# Patient Record
Sex: Male | Born: 1960 | Race: White | Hispanic: No | Smoking: Former smoker
Health system: Southern US, Community
[De-identification: ages and names within clinical notes are randomized; demographics above are authoritative.]

## PROBLEM LIST (undated history)

## (undated) DIAGNOSIS — M545 Low back pain: Secondary | ICD-10-CM

## (undated) DIAGNOSIS — E538 Deficiency of other specified B group vitamins: Secondary | ICD-10-CM

## (undated) DIAGNOSIS — G8929 Other chronic pain: Secondary | ICD-10-CM

## (undated) DIAGNOSIS — M47816 Spondylosis without myelopathy or radiculopathy, lumbar region: Secondary | ICD-10-CM

## (undated) DIAGNOSIS — I10 Essential (primary) hypertension: Secondary | ICD-10-CM

## (undated) DIAGNOSIS — K900 Celiac disease: Secondary | ICD-10-CM

## (undated) DIAGNOSIS — D649 Anemia, unspecified: Secondary | ICD-10-CM

## (undated) DIAGNOSIS — E7800 Pure hypercholesterolemia, unspecified: Secondary | ICD-10-CM

## (undated) DIAGNOSIS — F1021 Alcohol dependence, in remission: Secondary | ICD-10-CM

## (undated) HISTORY — PX: COLONOSCOPY: SHX174

## (undated) HISTORY — DX: Spondylosis without myelopathy or radiculopathy, lumbar region: M47.816

## (undated) HISTORY — PX: GANGLION CYST EXCISION: SHX1691

## (undated) HISTORY — DX: Low back pain: M54.5

## (undated) HISTORY — DX: Pure hypercholesterolemia, unspecified: E78.00

## (undated) HISTORY — DX: Essential (primary) hypertension: I10

## (undated) HISTORY — DX: Alcohol dependence, in remission: F10.21

## (undated) HISTORY — DX: Other chronic pain: G89.29

## (undated) HISTORY — DX: Celiac disease: K90.0

---

## 2007-12-08 ENCOUNTER — Ambulatory Visit: Payer: Self-pay | Admitting: Gastroenterology

## 2013-12-28 DIAGNOSIS — F32A Depression, unspecified: Secondary | ICD-10-CM | POA: Insufficient documentation

## 2013-12-28 DIAGNOSIS — M545 Low back pain, unspecified: Secondary | ICD-10-CM | POA: Insufficient documentation

## 2013-12-28 DIAGNOSIS — F329 Major depressive disorder, single episode, unspecified: Secondary | ICD-10-CM | POA: Insufficient documentation

## 2013-12-28 DIAGNOSIS — I10 Essential (primary) hypertension: Secondary | ICD-10-CM

## 2013-12-28 DIAGNOSIS — G8929 Other chronic pain: Secondary | ICD-10-CM

## 2013-12-28 HISTORY — DX: Other chronic pain: G89.29

## 2013-12-28 HISTORY — DX: Low back pain, unspecified: M54.50

## 2013-12-28 HISTORY — DX: Essential (primary) hypertension: I10

## 2014-02-02 DIAGNOSIS — M47816 Spondylosis without myelopathy or radiculopathy, lumbar region: Secondary | ICD-10-CM

## 2014-02-02 HISTORY — DX: Spondylosis without myelopathy or radiculopathy, lumbar region: M47.816

## 2014-04-08 DIAGNOSIS — E7800 Pure hypercholesterolemia, unspecified: Secondary | ICD-10-CM

## 2014-04-08 HISTORY — DX: Pure hypercholesterolemia, unspecified: E78.00

## 2014-04-15 ENCOUNTER — Ambulatory Visit: Payer: Self-pay | Admitting: Internal Medicine

## 2014-06-27 ENCOUNTER — Ambulatory Visit: Payer: Self-pay | Admitting: Internal Medicine

## 2014-08-21 DIAGNOSIS — K900 Celiac disease: Secondary | ICD-10-CM

## 2014-08-21 HISTORY — DX: Celiac disease: K90.0

## 2014-09-15 ENCOUNTER — Ambulatory Visit
Admit: 2014-09-15 | Disposition: A | Discharge status: Home / Self Care | Payer: Self-pay | Attending: Gastroenterology | Admitting: Gastroenterology

## 2014-09-19 ENCOUNTER — Encounter: Payer: Self-pay | Admitting: Internal Medicine

## 2017-05-29 ENCOUNTER — Encounter: Payer: Self-pay | Admitting: Urology

## 2017-05-29 ENCOUNTER — Ambulatory Visit (INDEPENDENT_AMBULATORY_CARE_PROVIDER_SITE_OTHER): Admitting: Urology

## 2017-05-29 VITALS — BP 148/84 | HR 88 | Ht 66.0 in | Wt 156.0 lb

## 2017-05-29 DIAGNOSIS — F1021 Alcohol dependence, in remission: Secondary | ICD-10-CM

## 2017-05-29 DIAGNOSIS — L03314 Cellulitis of groin: Secondary | ICD-10-CM | POA: Diagnosis not present

## 2017-05-29 HISTORY — DX: Alcohol dependence, in remission: F10.21

## 2017-05-29 NOTE — Progress Notes (Signed)
05/29/2017 4:52 PM   Austin Mccarthy January 09, 1961 [id number]  Referring provider: Ignacia Bayleyumey, Robert, PA-C [address] [place], [place] [address]  Chief Complaint  Patient presents with  . Cellulitis    New Patient    HPI: Austin Mccarthy is a 56 year old male [facility] at [facility] on 05/23/2017 with a 2-day history of redness and swelling in the groin region.  His exam was described as tenderness in the left inguinal region with induration and abscess versus lymphadenopathy.  There was erythema in the pubic region.  He had a negative evaluation for chlamydia and gonorrhea.  He was treated with Keflex and states he noted significant improvement in his pain, swelling and erythema.  He presently has no pain.   PMH: Past Medical History:  Diagnosis Date  . Celiac disease/sprue 08/21/2014  . Chronic low back pain 12/28/2013  . History of alcoholism (HCC) 05/29/2017  . Hypercholesterolemia 04/08/2014  . Hypertension 12/28/2013  . Lumbar spondylosis 02/02/2014    Surgical History: History reviewed. No pertinent surgical history.  Home Medications:  Allergies as of 05/29/2017   No Known Allergies     Medication List        Accurate as of 05/29/17  4:52 PM. Always use your most recent med list.          aspirin EC 81 MG tablet Take by mouth.   cephALEXin 500 MG capsule Commonly known as:  KEFLEX take 1 capsule by mouth four times a day   clonazePAM 1 MG tablet Commonly known as:  KLONOPIN   DULoxetine 60 MG capsule Commonly known as:  CYMBALTA Take by mouth.   nystatin cream Commonly known as:  MYCOSTATIN apply to affected area twice a day   pravastatin 80 MG tablet Commonly known as:  PRAVACHOL       Allergies: No Known Allergies  Family History: Family History  Problem Relation Age of Onset  . Prostate cancer Neg Hx   . Kidney cancer Neg Hx   . Bladder Cancer Neg Hx     Social History:  reports that he has  quit smoking. he has never used smokeless tobacco. He reports that he does not drink alcohol or use drugs.  ROS: UROLOGY Frequent Urination?: No Hard to postpone urination?: No Burning/pain with urination?: No Get up at night to urinate?: Yes Leakage of urine?: Yes Urine stream starts and stops?: No Trouble starting stream?: Yes Do you have to strain to urinate?: No Blood in urine?: No Urinary tract infection?: No Sexually transmitted disease?: No Injury to kidneys or bladder?: No Painful intercourse?: Yes Weak stream?: Yes Erection problems?: Yes Penile pain?: No  Gastrointestinal Nausea?: No Vomiting?: No Indigestion/heartburn?: No Diarrhea?: No Constipation?: No  Constitutional Fever: No Night sweats?: No Weight loss?: No Fatigue?: No  Skin Skin rash/lesions?: No Itching?: No  Eyes Blurred vision?: No Double vision?: No  Ears/Nose/Throat Sore throat?: No Sinus problems?: No  Hematologic/Lymphatic Swollen glands?: No Easy bruising?: Yes  Cardiovascular Leg swelling?: No Chest pain?: No  Respiratory Cough?: No Shortness of breath?: Yes  Endocrine Excessive thirst?: No  Musculoskeletal Back pain?: Yes Joint pain?: No  Neurological Headaches?: Yes Dizziness?: No  Psychologic Depression?: Yes Anxiety?: Yes  Physical Exam: BP (!) 148/84   Pulse 88   Ht 5\' 6"  (1.676 m)   Wt 156 lb (70.8 kg)   BMI 25.18 kg/m   Constitutional:  Alert and oriented, No acute distress. HEENT: [place] AT, moist mucus membranes.  Trachea  midline, no masses. Cardiovascular: No clubbing, cyanosis, or edema. Respiratory: Normal respiratory effort, no increased work of breathing. GI: Abdomen is soft, nontender, nondistended, no abdominal masses GU: No CVA tenderness.  Penis without lesions, testes descended bilaterally without masses or tenderness, no peritesticular abnormalities.  Approximately 1 cm firm mass left inguinal region most likely representing an inguinal  lymph node. Skin: No rashes, bruises or suspicious lesions. Lymph: No cervical or inguinal adenopathy. Neurologic: Grossly intact, no focal deficits, moving all 4 extremities. Psychiatric: Normal mood and affect.   Assessment & Plan:   Significant improvement in his inguinal erythema, swelling and tenderness after antibiotic therapy.  Have recommended a follow-up exam in approximately 2 months and he was instructed to call earlier for any worsening symptoms.   Return in about 6 weeks (around 07/10/2017).  Riki AltesScott C Coni Homesley, MD  [facility] [address], [address] [place], [place] [address] [phone]

## 2017-07-14 ENCOUNTER — Ambulatory Visit: Admitting: Urology

## 2018-12-22 DIAGNOSIS — E538 Deficiency of other specified B group vitamins: Secondary | ICD-10-CM | POA: Insufficient documentation

## 2019-09-16 DIAGNOSIS — Z720 Tobacco use: Secondary | ICD-10-CM | POA: Insufficient documentation

## 2019-12-29 ENCOUNTER — Emergency Department
Admission: EM | Admit: 2019-12-29 | Discharge: 2019-12-29 | Disposition: A | Discharge status: Home / Self Care | Attending: Emergency Medicine | Admitting: Emergency Medicine

## 2019-12-29 ENCOUNTER — Encounter: Payer: Self-pay | Admitting: Emergency Medicine

## 2019-12-29 ENCOUNTER — Other Ambulatory Visit: Payer: Self-pay

## 2019-12-29 DIAGNOSIS — Z5321 Procedure and treatment not carried out due to patient leaving prior to being seen by health care provider: Secondary | ICD-10-CM | POA: Diagnosis not present

## 2019-12-29 DIAGNOSIS — R109 Unspecified abdominal pain: Secondary | ICD-10-CM | POA: Diagnosis present

## 2019-12-29 LAB — URINALYSIS, COMPLETE (UACMP) WITH MICROSCOPIC: Specific Gravity, Urine: 1.02 (ref 1.005–1.030)

## 2019-12-29 LAB — CBC
HCT: 48 % (ref 39.0–52.0)
Hemoglobin: 15.9 g/dL (ref 13.0–17.0)
MCH: 31.4 pg (ref 26.0–34.0)
MCHC: 33.1 g/dL (ref 30.0–36.0)
MCV: 94.9 fL (ref 80.0–100.0)
Platelets: 273 10*3/uL (ref 150–400)
RBC: 5.06 MIL/uL (ref 4.22–5.81)
RDW: 12.5 % (ref 11.5–15.5)
WBC: 11.4 10*3/uL — ABNORMAL HIGH (ref 4.0–10.5)
nRBC: 0 % (ref 0.0–0.2)

## 2019-12-29 LAB — BASIC METABOLIC PANEL
Anion gap: 10 (ref 5–15)
BUN: 14 mg/dL (ref 6–20)
CO2: 27 mmol/L (ref 22–32)
Calcium: 8.9 mg/dL (ref 8.9–10.3)
Chloride: 103 mmol/L (ref 98–111)
Creatinine, Ser: 0.97 mg/dL (ref 0.61–1.24)
GFR calc Af Amer: >60 mL/min (ref >60)
GFR calc non Af Amer: >60 mL/min (ref >60)
Glucose, Bld: 105 mg/dL — ABNORMAL HIGH (ref 70–99)
Potassium: 4.3 mmol/L (ref 3.5–5.1)
Sodium: 140 mmol/L (ref 135–145)

## 2019-12-29 NOTE — ED Triage Notes (Addendum)
C/O left flank pain x since 1330.  States took AZO today at 1600  AAOx3.  Skin warm and dry. NAD

## 2019-12-30 ENCOUNTER — Telehealth: Payer: Self-pay | Admitting: Emergency Medicine

## 2019-12-30 NOTE — Telephone Encounter (Signed)
Called patient due to lwot to inquire about condition and follow up plans.  Says he has appt with his doctor.

## 2019-12-31 ENCOUNTER — Other Ambulatory Visit: Payer: Self-pay | Admitting: Internal Medicine

## 2019-12-31 DIAGNOSIS — R1032 Left lower quadrant pain: Secondary | ICD-10-CM

## 2020-01-04 ENCOUNTER — Ambulatory Visit
Admission: RE | Admit: 2020-01-04 | Discharge: 2020-01-04 | Disposition: A | Discharge status: Home / Self Care | Source: Ambulatory Visit | Attending: Internal Medicine | Admitting: Internal Medicine

## 2020-01-04 ENCOUNTER — Other Ambulatory Visit: Payer: Self-pay

## 2020-01-04 DIAGNOSIS — R1032 Left lower quadrant pain: Secondary | ICD-10-CM | POA: Insufficient documentation

## 2020-05-23 ENCOUNTER — Other Ambulatory Visit: Payer: Self-pay | Admitting: *Deleted

## 2020-05-23 ENCOUNTER — Other Ambulatory Visit: Payer: Self-pay | Admitting: Rheumatology

## 2020-05-23 DIAGNOSIS — M519 Unspecified thoracic, thoracolumbar and lumbosacral intervertebral disc disorder: Secondary | ICD-10-CM

## 2020-05-23 DIAGNOSIS — M47816 Spondylosis without myelopathy or radiculopathy, lumbar region: Secondary | ICD-10-CM

## 2020-06-01 ENCOUNTER — Other Ambulatory Visit: Payer: Self-pay

## 2020-06-01 ENCOUNTER — Ambulatory Visit
Admission: RE | Admit: 2020-06-01 | Discharge: 2020-06-01 | Disposition: A | Discharge status: Home / Self Care | Source: Ambulatory Visit | Attending: Rheumatology | Admitting: Rheumatology

## 2020-06-01 DIAGNOSIS — M519 Unspecified thoracic, thoracolumbar and lumbosacral intervertebral disc disorder: Secondary | ICD-10-CM | POA: Insufficient documentation

## 2020-06-01 DIAGNOSIS — M47816 Spondylosis without myelopathy or radiculopathy, lumbar region: Secondary | ICD-10-CM | POA: Diagnosis not present

## 2020-11-01 DIAGNOSIS — M79606 Pain in leg, unspecified: Secondary | ICD-10-CM | POA: Insufficient documentation

## 2020-11-01 NOTE — Progress Notes (Signed)
MRN : [id number]  Austin Mccarthy is a 60 y.o. (11/22/1960) male who presents with chief complaint of No chief complaint on file. Marland Kitchen  History of Present Illness:   The patient is seen for evaluation of painful lower extremities. Patient notes the pain is variable and not always associated with activity.  The pain is somewhat consistent day to day occurring on most days. The patient notes the pain also occurs with standing and routinely seems worse as the day wears on. The pain has been progressive over the past several years. The patient states these symptoms are causing  a profound negative impact on quality of life and daily activities.  The patient denies rest pain or dangling of an extremity off the side of the bed during the night for relief. No open wounds or sores at this time. No history of DVT or phlebitis. No prior interventions or surgeries.  There is a  history of back problems and DJD of the lumbar and sacral spine.   ABI's Rt=1.16 (monophasic signals) and Lt=0.83 (monophasic signals)  No outpatient medications have been marked as taking for the 11/02/20 encounter (Appointment) with Gilda Crease, Latina Craver, MD.    Past Medical History:  Diagnosis Date  . Celiac disease/sprue 08/21/2014  . Chronic low back pain 12/28/2013  . History of alcoholism (HCC) 05/29/2017  . Hypercholesterolemia 04/08/2014  . Hypertension 12/28/2013  . Lumbar spondylosis 02/02/2014    No past surgical history on file.  Social History Social History   Tobacco Use  . Smoking status: Current Every Day Smoker    Types: Cigarettes  . Smokeless tobacco: Never Used  Substance Use Topics  . Alcohol use: No  . Drug use: No    Family History Family History  Problem Relation Age of Onset  . Prostate cancer Neg Hx   . Kidney cancer Neg Hx   . Bladder Cancer Neg Hx   No family history of bleeding/clotting disorders, porphyria or autoimmune disease   No Known Allergies   REVIEW OF SYSTEMS  (Negative unless checked)  Constitutional: [] Weight loss  [] Fever  [] Chills Cardiac: [] Chest pain   [] Chest pressure   [] Palpitations   [] Shortness of breath when laying flat   [] Shortness of breath with exertion. Vascular:  [x] Pain in legs with walking   [x] Pain in legs at rest  [] History of DVT   [] Phlebitis   [] Swelling in legs   [] Varicose veins   [] Non-healing ulcers Pulmonary:   [] Uses home oxygen   [] Productive cough   [] Hemoptysis   [] Wheeze  [] COPD   [] Asthma Neurologic:  [] Dizziness   [] Seizures   [] History of stroke   [] History of TIA  [] Aphasia   [] Vissual changes   [] Weakness or numbness in arm   [] Weakness or numbness in leg Musculoskeletal:   [] Joint swelling   [x] Joint pain   [x] Low back pain Hematologic:  [] Easy bruising  [] Easy bleeding   [] Hypercoagulable state   [] Anemic Gastrointestinal:  [] Diarrhea   [] Vomiting  [] Gastroesophageal reflux/heartburn   [] Difficulty swallowing. Genitourinary:  [] Chronic kidney disease   [] Difficult urination  [] Frequent urination   [] Blood in urine Skin:  [] Rashes   [] Ulcers  Psychological:  [] History of anxiety   []  History of major depression.  Physical Examination  There were no vitals filed for this visit. There is no height or weight on file to calculate BMI. Gen: WD/WN, NAD Head: [place]/AT, No temporalis wasting.  Ear/Nose/Throat: Hearing grossly intact, nares w/o erythema or drainage, poor dentition Eyes: PER,  EOMI, sclera nonicteric.  Neck: Supple, no masses.  No bruit or JVD.  Pulmonary:  Good air movement, clear to auscultation bilaterally, no use of accessory muscles.  Cardiac: RRR, normal S1, S2, no Murmurs. Vascular:  Vessel Right Left  Radial Palpable Palpable  PT Not Palpable Not Palpable  DP Not Palpable Not Palpable  Gastrointestinal: soft, non-distended. No guarding/no peritoneal signs.  Musculoskeletal: M/S 5/5 throughout.  No deformity or atrophy.  Neurologic: CN 2-12 intact. Pain and light touch intact in  extremities.  Symmetrical.  Speech is fluent. Motor exam as listed above. Psychiatric: Judgment intact, Mood & affect appropriate for pt's clinical situation. Dermatologic: No rashes or ulcers noted.  No changes consistent with cellulitis.  CBC Lab Results  Component Value Date   WBC 11.4 (H) 12/29/2019   HGB 15.9 12/29/2019   HCT 48.0 12/29/2019   MCV 94.9 12/29/2019   PLT 273 12/29/2019    BMET    Component Value Date/Time   NA 140 12/29/2019 1611   K 4.3 12/29/2019 1611   CL 103 12/29/2019 1611   CO2 27 12/29/2019 1611   GLUCOSE 105 (H) 12/29/2019 1611   BUN 14 12/29/2019 1611   CREATININE 0.97 12/29/2019 1611   CALCIUM 8.9 12/29/2019 1611   GFRNONAA >60 12/29/2019 1611   GFRAA >60 12/29/2019 1611   CrCl cannot be calculated (Patient's most recent lab result is older than the maximum 21 days allowed.).  COAG No results found for: INR, PROTIME  Radiology No results found.   Assessment/Plan 1. Pain in both lower extremities Recommend:  Patient should undergo arterial duplex of the lower extremity because og the severity of the patient's lower extremity symptoms.  The patient states they are having increased pain and a marked decrease in the distance that they can walk.  The risks and benefits as well as the alternatives were discussed in detail with the patient.  All questions were answered.  Patient agrees to proceed and understands this could be a prelude to angiography and intervention.  The patient will follow up with me in the office to review the studies.   - VAS US AORTA/IVC/ILIACS; Future  2. Atherosclerosis of native artery of both lower extremities with intermittent claudication (HCC) Recommend:  Patient should undergo arterial duplex of the lower extremity because og the severity of the patient's lower extremity symptoms.  The patient states they are having increased pain and a marked decrease in the distance that they can walk.  The risks and  benefits as well as the alternatives were discussed in detail with the patient.  All questions were answered.  Patient agrees to proceed and understands this could be a prelude to angiography and intervention.  The patient will follow up with me in the office to review the studies.   - VAS US AORTA/IVC/ILIACS; Future  3. Hypercholesterolemia Continue statin as ordered and reviewed, no changes at this time   4. Primary hypertension Continue antihypertensive medications as already ordered, these medications have been reviewed and there are no changes at this time.     [name], MD  11/01/2020 5:49 PM

## 2020-11-02 ENCOUNTER — Other Ambulatory Visit (INDEPENDENT_AMBULATORY_CARE_PROVIDER_SITE_OTHER): Payer: Self-pay | Admitting: Vascular Surgery

## 2020-11-02 ENCOUNTER — Other Ambulatory Visit: Payer: Self-pay

## 2020-11-02 ENCOUNTER — Ambulatory Visit (INDEPENDENT_AMBULATORY_CARE_PROVIDER_SITE_OTHER)

## 2020-11-02 ENCOUNTER — Encounter (INDEPENDENT_AMBULATORY_CARE_PROVIDER_SITE_OTHER): Payer: Self-pay | Admitting: Vascular Surgery

## 2020-11-02 ENCOUNTER — Ambulatory Visit (INDEPENDENT_AMBULATORY_CARE_PROVIDER_SITE_OTHER): Admitting: Vascular Surgery

## 2020-11-02 VITALS — BP 137/84 | HR 74 | Resp 16 | Ht 66.0 in | Wt 147.2 lb

## 2020-11-02 DIAGNOSIS — M79604 Pain in right leg: Secondary | ICD-10-CM | POA: Diagnosis not present

## 2020-11-02 DIAGNOSIS — I70213 Atherosclerosis of native arteries of extremities with intermittent claudication, bilateral legs: Secondary | ICD-10-CM

## 2020-11-02 DIAGNOSIS — R202 Paresthesia of skin: Secondary | ICD-10-CM

## 2020-11-02 DIAGNOSIS — M79605 Pain in left leg: Secondary | ICD-10-CM

## 2020-11-02 DIAGNOSIS — I10 Essential (primary) hypertension: Secondary | ICD-10-CM | POA: Diagnosis not present

## 2020-11-02 DIAGNOSIS — E7800 Pure hypercholesterolemia, unspecified: Secondary | ICD-10-CM | POA: Diagnosis not present

## 2020-11-04 DIAGNOSIS — I70219 Atherosclerosis of native arteries of extremities with intermittent claudication, unspecified extremity: Secondary | ICD-10-CM | POA: Insufficient documentation

## 2020-11-21 NOTE — Progress Notes (Signed)
MRN : [id number]  Austin Mccarthy is a 60 y.o. (12/04/1960) male who presents with chief complaint of No chief complaint on file. Marland Kitchen  History of Present Illness:  The patient returns to the office for followup and review of the noninvasive studies. The patient continues to complain of severe pain and claudication in the lower extremity symptoms.  The patient notes mild rest pain symptoms. No new ulcers or wounds have occurred since the last visit.  There have been no significant changes to the patient's overall health care.  The patient denies amaurosis fugax or recent TIA symptoms. There are no recent neurological changes noted. The patient denies history of DVT, PE or superficial thrombophlebitis. The patient denies recent episodes of angina or shortness of breath.   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    Past Medical History:  Diagnosis Date   Celiac disease/sprue 08/21/2014   Chronic low back pain 12/28/2013   History of alcoholism (HCC) 05/29/2017   Hypercholesterolemia 04/08/2014   Hypertension 12/28/2013   Lumbar spondylosis 02/02/2014    Past Surgical History:  Procedure Laterality Date   GANGLION CYST EXCISION Left    foot    Social History Social History   Tobacco Use   Smoking status: Every Day    Pack years: 0.00    Types: Cigarettes   Smokeless tobacco: Never  Substance Use Topics   Alcohol use: No   Drug use: No    Family History Family History  Problem Relation Age of Onset   Heart attack Father    Colon cancer Father    Prostate cancer Neg Hx    Kidney cancer Neg Hx    Bladder Cancer Neg Hx     Allergies  Allergen Reactions   Gluten Meal Diarrhea    Other reaction(s): Abdominal Pain Celiac  Disease     REVIEW OF SYSTEMS (Negative unless checked)  Constitutional: [] Weight loss  [] Fever  [] Chills Cardiac: [] Chest pain   [] Chest pressure   [] Palpitations   [] Shortness of breath when laying flat   [] Shortness of breath with exertion. Vascular:  [x] Pain in legs with walking   [x] Pain in legs at rest  [] History of DVT   [] Phlebitis   [] Swelling in legs   [] Varicose veins   [] Non-healing ulcers Pulmonary:   [] Uses home oxygen   [] Productive cough   [] Hemoptysis   [] Wheeze  [] COPD   [] Asthma Neurologic:  [] Dizziness   [] Seizures   [] History of stroke   [] History of TIA  [] Aphasia   [] Vissual changes   [] Weakness or numbness in arm   [] Weakness or numbness in leg Musculoskeletal:   [] Joint swelling   [] Joint pain   [] Low back pain Hematologic:  [] Easy bruising  [] Easy bleeding   [] Hypercoagulable state   [] Anemic Gastrointestinal:  [] Diarrhea   [] Vomiting  [] Gastroesophageal reflux/heartburn   [] Difficulty swallowing. Genitourinary:  [] Chronic kidney disease   [] Difficult urination  [] Frequent urination   [] Blood in urine Skin:  [] Rashes   [] Ulcers  Psychological:  [] History of anxiety   []  History of major depression.  Physical Examination  There were no vitals filed for this visit. There is no height or weight on file to calculate BMI. Gen: WD/WN, NAD Head: [place]/AT, No temporalis wasting.  Ear/Nose/Throat:  Hearing grossly intact, nares w/o erythema or drainage Eyes: PER, EOMI, sclera nonicteric.  Neck: Supple, no large masses.   Pulmonary:  Good air movement, no audible wheezing bilaterally, no use of accessory muscles.  Cardiac: RRR, no JVD Vascular:    Vessel Right Left  Radial Palpable Palpable  PT Not Palpable Not Palpable  DP Not Palpable Not Palpable  Gastrointestinal: Non-distended. No guarding/no peritoneal signs.  Musculoskeletal: M/S 5/5 throughout.  No deformity or atrophy.  Neurologic: CN 2-12 intact. Symmetrical.  Speech is fluent. Motor exam as listed  above. Psychiatric: Judgment intact, Mood & affect appropriate for pt's clinical situation. Dermatologic: No rashes or ulcers noted.  No changes consistent with cellulitis. Lymph : No lichenification or skin changes of chronic lymphedema.  CBC Lab Results  Component Value Date   WBC 11.4 (H) 12/29/2019   HGB 15.9 12/29/2019   HCT 48.0 12/29/2019   MCV 94.9 12/29/2019   PLT 273 12/29/2019    BMET    Component Value Date/Time   NA 140 12/29/2019 1611   K 4.3 12/29/2019 1611   CL 103 12/29/2019 1611   CO2 27 12/29/2019 1611   GLUCOSE 105 (H) 12/29/2019 1611   BUN 14 12/29/2019 1611   CREATININE 0.97 12/29/2019 1611   CALCIUM 8.9 12/29/2019 1611   GFRNONAA >60 12/29/2019 1611   GFRAA >60 12/29/2019 1611   CrCl cannot be calculated (Patient's most recent lab result is older than the maximum 21 days allowed.).  COAG No results found for: INR, PROTIME  Radiology VAS [place] ABI WITH/WO TBI  Result Date: 11/06/2020  LOWER EXTREMITY DOPPLER STUDY Patient Name:  Austin Mccarthy  Date of Exam:   11/02/2020 Medical Rec #: [id number]            Accession #:    [id number] Date of Birth: Jun 16, 1960            Patient Gender: M Patient Age:   59Y Exam Location:  [facility] Procedure:      VAS [place] ABI WITH/WO TBI Referring Phys: [id number] Latina Craver Yalissa Fink --------------------------------------------------------------------------------  Indications: Claudication, and left>right.  Performing Technologist: Salvadore Farber RVT  Examination Guidelines: A complete evaluation includes at minimum, Doppler waveform signals and systolic blood pressure reading at the level of bilateral brachial, anterior tibial, and posterior tibial arteries, when vessel segments are accessible. Bilateral testing is considered an integral part of a complete examination. Photoelectric Plethysmograph (PPG) waveforms and toe systolic pressure readings are included as required and additional duplex testing as needed. Limited  examinations for reoccurring indications may be performed as noted.  ABI Findings: +---------+------------------+-----+--------+--------+ Right    Rt Pressure (mmHg)IndexWaveformComment  +---------+------------------+-----+--------+--------+ Brachial 134                                     +---------+------------------+-----+--------+--------+ ATA      125               0.93 biphasic         +---------+------------------+-----+--------+--------+ PTA      156               1.16 biphasic         +---------+------------------+-----+--------+--------+ Great Toe63                0.47 Abnormal         +---------+------------------+-----+--------+--------+ +---------+------------------+-----+----------+-------+ Left     Lt Pressure (mmHg)IndexWaveform  Comment +---------+------------------+-----+----------+-------+  ATA      110               0.82 monophasic        +---------+------------------+-----+----------+-------+ PTA      111               0.83 biphasic          +---------+------------------+-----+----------+-------+ Great Toe44                0.33 Abnormal          +---------+------------------+-----+----------+-------+  Summary: Right: Resting right ankle-brachial index is within normal range. No evidence of significant right lower extremity arterial disease. The right toe-brachial index is abnormal. Left: Resting left ankle-brachial index indicates mild left lower extremity arterial disease. The left toe-brachial index is abnormal.  *See table(s) above for measurements and observations.  Electronically signed by [name] MD on 11/06/2020 at 2:52:46 PM.    Final      Assessment/Plan 1. Atherosclerosis of native artery of both lower extremities with intermittent claudication (HCC) Recommend:  The patient has experienced increased symptoms and is now describing lifestyle limiting claudication and mild rest pain.  Noninvasive studies show extensive  iliac disease  Given the severity of the patient's lower extremity symptoms the patient should undergo angiography and intervention.  Risk and benefits were reviewed the patient.  Indications for the procedure were reviewed.  All questions were answered, the patient agrees to proceed aorta iliac intervention.   The patient should continue walking and begin a more formal exercise program.   The patient should continue antiplatelet therapy and aggressive treatment of the lipid abnormalities  The patient will follow up with me after the angiogram.    2. Primary hypertension Continue antihypertensive medications as already ordered, these medications have been reviewed and there are no changes at this time.   3. Hypercholesterolemia Continue statin as ordered and reviewed, no changes at this time     [name], MD  11/21/2020 3:30 PM

## 2020-11-21 NOTE — H&P (View-Only) (Signed)
MRN : [id number]  Austin Mccarthy is a 60 y.o. (12/04/1960) male who presents with chief complaint of No chief complaint on file. Marland Kitchen  History of Present Illness:  The patient returns to the office for followup and review of the noninvasive studies. The patient continues to complain of severe pain and claudication in the lower extremity symptoms.  The patient notes mild rest pain symptoms. No new ulcers or wounds have occurred since the last visit.  There have been no significant changes to the patient's overall health care.  The patient denies amaurosis fugax or recent TIA symptoms. There are no recent neurological changes noted. The patient denies history of DVT, PE or superficial thrombophlebitis. The patient denies recent episodes of angina or shortness of breath.   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    Past Medical History:  Diagnosis Date   Celiac disease/sprue 08/21/2014   Chronic low back pain 12/28/2013   History of alcoholism (HCC) 05/29/2017   Hypercholesterolemia 04/08/2014   Hypertension 12/28/2013   Lumbar spondylosis 02/02/2014    Past Surgical History:  Procedure Laterality Date   GANGLION CYST EXCISION Left    foot    Social History Social History   Tobacco Use   Smoking status: Every Day    Pack years: 0.00    Types: Cigarettes   Smokeless tobacco: Never  Substance Use Topics   Alcohol use: No   Drug use: No    Family History Family History  Problem Relation Age of Onset   Heart attack Father    Colon cancer Father    Prostate cancer Neg Hx    Kidney cancer Neg Hx    Bladder Cancer Neg Hx     Allergies  Allergen Reactions   Gluten Meal Diarrhea    Other reaction(s): Abdominal Pain Celiac  Disease     REVIEW OF SYSTEMS (Negative unless checked)  Constitutional: [] Weight loss  [] Fever  [] Chills Cardiac: [] Chest pain   [] Chest pressure   [] Palpitations   [] Shortness of breath when laying flat   [] Shortness of breath with exertion. Vascular:  [x] Pain in legs with walking   [x] Pain in legs at rest  [] History of DVT   [] Phlebitis   [] Swelling in legs   [] Varicose veins   [] Non-healing ulcers Pulmonary:   [] Uses home oxygen   [] Productive cough   [] Hemoptysis   [] Wheeze  [] COPD   [] Asthma Neurologic:  [] Dizziness   [] Seizures   [] History of stroke   [] History of TIA  [] Aphasia   [] Vissual changes   [] Weakness or numbness in arm   [] Weakness or numbness in leg Musculoskeletal:   [] Joint swelling   [] Joint pain   [] Low back pain Hematologic:  [] Easy bruising  [] Easy bleeding   [] Hypercoagulable state   [] Anemic Gastrointestinal:  [] Diarrhea   [] Vomiting  [] Gastroesophageal reflux/heartburn   [] Difficulty swallowing. Genitourinary:  [] Chronic kidney disease   [] Difficult urination  [] Frequent urination   [] Blood in urine Skin:  [] Rashes   [] Ulcers  Psychological:  [] History of anxiety   []  History of major depression.  Physical Examination  There were no vitals filed for this visit. There is no height or weight on file to calculate BMI. Gen: WD/WN, NAD Head: [place]/AT, No temporalis wasting.  Ear/Nose/Throat:  Hearing grossly intact, nares w/o erythema or drainage Eyes: PER, EOMI, sclera nonicteric.  Neck: Supple, no large masses.   Pulmonary:  Good air movement, no audible wheezing bilaterally, no use of accessory muscles.  Cardiac: RRR, no JVD Vascular:    Vessel Right Left  Radial Palpable Palpable  PT Not Palpable Not Palpable  DP Not Palpable Not Palpable  Gastrointestinal: Non-distended. No guarding/no peritoneal signs.  Musculoskeletal: M/S 5/5 throughout.  No deformity or atrophy.  Neurologic: CN 2-12 intact. Symmetrical.  Speech is fluent. Motor exam as listed  above. Psychiatric: Judgment intact, Mood & affect appropriate for pt's clinical situation. Dermatologic: No rashes or ulcers noted.  No changes consistent with cellulitis. Lymph : No lichenification or skin changes of chronic lymphedema.  CBC Lab Results  Component Value Date   WBC 11.4 (H) 12/29/2019   HGB 15.9 12/29/2019   HCT 48.0 12/29/2019   MCV 94.9 12/29/2019   PLT 273 12/29/2019    BMET    Component Value Date/Time   NA 140 12/29/2019 1611   K 4.3 12/29/2019 1611   CL 103 12/29/2019 1611   CO2 27 12/29/2019 1611   GLUCOSE 105 (H) 12/29/2019 1611   BUN 14 12/29/2019 1611   CREATININE 0.97 12/29/2019 1611   CALCIUM 8.9 12/29/2019 1611   GFRNONAA >60 12/29/2019 1611   GFRAA >60 12/29/2019 1611   CrCl cannot be calculated (Patient's most recent lab result is older than the maximum 21 days allowed.).  COAG No results found for: INR, PROTIME  Radiology VAS [place] ABI WITH/WO TBI  Result Date: 11/06/2020  LOWER EXTREMITY DOPPLER STUDY Patient Name:  BRELAN HANNEN  Date of Exam:   11/02/2020 Medical Rec #: [id number]            Accession #:    [id number] Date of Birth: Jun 16, 1960            Patient Gender: M Patient Age:   59Y Exam Location:  [facility] Procedure:      VAS [place] ABI WITH/WO TBI Referring Phys: [id number] Latina Craver Makinna Andy --------------------------------------------------------------------------------  Indications: Claudication, and left>right.  Performing Technologist: Salvadore Farber RVT  Examination Guidelines: A complete evaluation includes at minimum, Doppler waveform signals and systolic blood pressure reading at the level of bilateral brachial, anterior tibial, and posterior tibial arteries, when vessel segments are accessible. Bilateral testing is considered an integral part of a complete examination. Photoelectric Plethysmograph (PPG) waveforms and toe systolic pressure readings are included as required and additional duplex testing as needed. Limited  examinations for reoccurring indications may be performed as noted.  ABI Findings: +---------+------------------+-----+--------+--------+ Right    Rt Pressure (mmHg)IndexWaveformComment  +---------+------------------+-----+--------+--------+ Brachial 134                                     +---------+------------------+-----+--------+--------+ ATA      125               0.93 biphasic         +---------+------------------+-----+--------+--------+ PTA      156               1.16 biphasic         +---------+------------------+-----+--------+--------+ Great Toe63                0.47 Abnormal         +---------+------------------+-----+--------+--------+ +---------+------------------+-----+----------+-------+ Left     Lt Pressure (mmHg)IndexWaveform  Comment +---------+------------------+-----+----------+-------+  ATA      110               0.82 monophasic        +---------+------------------+-----+----------+-------+ PTA      111               0.83 biphasic          +---------+------------------+-----+----------+-------+ Great Toe44                0.33 Abnormal          +---------+------------------+-----+----------+-------+  Summary: Right: Resting right ankle-brachial index is within normal range. No evidence of significant right lower extremity arterial disease. The right toe-brachial index is abnormal. Left: Resting left ankle-brachial index indicates mild left lower extremity arterial disease. The left toe-brachial index is abnormal.  *See table(s) above for measurements and observations.  Electronically signed by [name] MD on 11/06/2020 at 2:52:46 PM.    Final      Assessment/Plan 1. Atherosclerosis of native artery of both lower extremities with intermittent claudication (HCC) Recommend:  The patient has experienced increased symptoms and is now describing lifestyle limiting claudication and mild rest pain.  Noninvasive studies show extensive  iliac disease  Given the severity of the patient's lower extremity symptoms the patient should undergo angiography and intervention.  Risk and benefits were reviewed the patient.  Indications for the procedure were reviewed.  All questions were answered, the patient agrees to proceed aorta iliac intervention.   The patient should continue walking and begin a more formal exercise program.   The patient should continue antiplatelet therapy and aggressive treatment of the lipid abnormalities  The patient will follow up with me after the angiogram.    2. Primary hypertension Continue antihypertensive medications as already ordered, these medications have been reviewed and there are no changes at this time.   3. Hypercholesterolemia Continue statin as ordered and reviewed, no changes at this time     [name], MD  11/21/2020 3:30 PM

## 2020-11-23 ENCOUNTER — Other Ambulatory Visit: Payer: Self-pay

## 2020-11-23 ENCOUNTER — Ambulatory Visit (INDEPENDENT_AMBULATORY_CARE_PROVIDER_SITE_OTHER)

## 2020-11-23 ENCOUNTER — Ambulatory Visit (INDEPENDENT_AMBULATORY_CARE_PROVIDER_SITE_OTHER): Admitting: Vascular Surgery

## 2020-11-23 VITALS — BP 133/81 | HR 60 | Resp 16 | Wt 145.8 lb

## 2020-11-23 DIAGNOSIS — M79605 Pain in left leg: Secondary | ICD-10-CM | POA: Diagnosis not present

## 2020-11-23 DIAGNOSIS — I10 Essential (primary) hypertension: Secondary | ICD-10-CM

## 2020-11-23 DIAGNOSIS — I70213 Atherosclerosis of native arteries of extremities with intermittent claudication, bilateral legs: Secondary | ICD-10-CM

## 2020-11-23 DIAGNOSIS — M79604 Pain in right leg: Secondary | ICD-10-CM

## 2020-11-23 DIAGNOSIS — E7800 Pure hypercholesterolemia, unspecified: Secondary | ICD-10-CM | POA: Diagnosis not present

## 2020-11-27 ENCOUNTER — Telehealth (INDEPENDENT_AMBULATORY_CARE_PROVIDER_SITE_OTHER): Payer: Self-pay

## 2020-11-27 ENCOUNTER — Encounter (INDEPENDENT_AMBULATORY_CARE_PROVIDER_SITE_OTHER): Payer: Self-pay | Admitting: Vascular Surgery

## 2020-11-27 NOTE — Telephone Encounter (Signed)
Spoke with the patient and he is scheduled with Dr. Gilda Crease for bilateral iliac stents on 12/12/20 with a 8:00 am arrival time to the MM. Pre-procedure instructions were discussed and will be mailed.

## 2020-12-11 ENCOUNTER — Other Ambulatory Visit (INDEPENDENT_AMBULATORY_CARE_PROVIDER_SITE_OTHER): Payer: Self-pay | Admitting: Nurse Practitioner

## 2020-12-12 ENCOUNTER — Encounter: Payer: Self-pay | Admitting: Vascular Surgery

## 2020-12-12 ENCOUNTER — Other Ambulatory Visit: Payer: Self-pay

## 2020-12-12 ENCOUNTER — Encounter
Admission: RE | Disposition: A | Discharge status: Home / Self Care | Payer: Self-pay | Source: Home / Self Care | Attending: Vascular Surgery

## 2020-12-12 ENCOUNTER — Ambulatory Visit
Admission: RE | Admit: 2020-12-12 | Discharge: 2020-12-12 | Disposition: A | Discharge status: Home / Self Care | Attending: Vascular Surgery | Admitting: Vascular Surgery

## 2020-12-12 DIAGNOSIS — I70223 Atherosclerosis of native arteries of extremities with rest pain, bilateral legs: Secondary | ICD-10-CM | POA: Diagnosis not present

## 2020-12-12 DIAGNOSIS — I70219 Atherosclerosis of native arteries of extremities with intermittent claudication, unspecified extremity: Secondary | ICD-10-CM

## 2020-12-12 DIAGNOSIS — I70213 Atherosclerosis of native arteries of extremities with intermittent claudication, bilateral legs: Secondary | ICD-10-CM | POA: Insufficient documentation

## 2020-12-12 DIAGNOSIS — Z9102 Food additives allergy status: Secondary | ICD-10-CM | POA: Diagnosis not present

## 2020-12-12 DIAGNOSIS — I10 Essential (primary) hypertension: Secondary | ICD-10-CM | POA: Insufficient documentation

## 2020-12-12 DIAGNOSIS — E7800 Pure hypercholesterolemia, unspecified: Secondary | ICD-10-CM | POA: Insufficient documentation

## 2020-12-12 DIAGNOSIS — F17210 Nicotine dependence, cigarettes, uncomplicated: Secondary | ICD-10-CM | POA: Insufficient documentation

## 2020-12-12 HISTORY — PX: LOWER EXTREMITY ANGIOGRAPHY: CATH118251

## 2020-12-12 LAB — CREATININE, SERUM
Creatinine, Ser: 0.91 mg/dL (ref 0.61–1.24)
GFR, Estimated: >60 mL/min (ref >60)

## 2020-12-12 LAB — BUN: BUN: 14 mg/dL (ref 6–20)

## 2020-12-12 SURGERY — LOWER EXTREMITY ANGIOGRAPHY
Anesthesia: Moderate Sedation | Laterality: Bilateral

## 2020-12-12 MED ORDER — ASPIRIN EC 81 MG PO TBEC: 81.0000 mg | DELAYED_RELEASE_TABLET | Freq: Every day | ORAL | 2 refills | Status: DC

## 2020-12-12 MED ORDER — SODIUM CHLORIDE 0.9 % IV SOLN: 250.0000 mL | INTRAVENOUS | Status: DC | PRN

## 2020-12-12 MED ORDER — CEFAZOLIN SODIUM-DEXTROSE 2-4 GM/100ML-% IV SOLN: 2.0000 g | Freq: Once | INTRAVENOUS | Status: AC

## 2020-12-12 MED ORDER — MIDAZOLAM HCL 2 MG/ML PO SYRP: 8.0000 mg | ORAL_SOLUTION | Freq: Once | ORAL | Status: DC | PRN

## 2020-12-12 MED ORDER — ASPIRIN EC 325 MG PO TBEC
DELAYED_RELEASE_TABLET | ORAL | Status: AC
  Administered 2020-12-12: 325 mg
  Filled 2020-12-12: qty 1

## 2020-12-12 MED ORDER — CLOPIDOGREL BISULFATE 75 MG PO TABS: 75.0000 mg | ORAL_TABLET | Freq: Every day | ORAL | 5 refills | Status: DC

## 2020-12-12 MED ORDER — MIDAZOLAM HCL 2 MG/2ML IJ SOLN
INTRAMUSCULAR | Status: DC | PRN
  Administered 2020-12-12: 1 mg via INTRAVENOUS
  Administered 2020-12-12: 2 mg via INTRAVENOUS

## 2020-12-12 MED ORDER — FAMOTIDINE 20 MG PO TABS: 40.0000 mg | ORAL_TABLET | Freq: Once | ORAL | Status: AC | PRN

## 2020-12-12 MED ORDER — HEPARIN SODIUM (PORCINE) 1000 UNIT/ML IJ SOLN
INTRAMUSCULAR | Status: DC | PRN
  Administered 2020-12-12: 5000 [IU] via INTRAVENOUS

## 2020-12-12 MED ORDER — SODIUM CHLORIDE 0.9 % IV SOLN: INTRAVENOUS | Status: DC

## 2020-12-12 MED ORDER — CLOPIDOGREL BISULFATE 300 MG PO TABS: 300.0000 mg | ORAL_TABLET | ORAL | Status: AC

## 2020-12-12 MED ORDER — FAMOTIDINE 20 MG PO TABS
ORAL_TABLET | ORAL | Status: AC
  Administered 2020-12-12: 40 mg via ORAL
  Filled 2020-12-12: qty 2

## 2020-12-12 MED ORDER — ONDANSETRON HCL 4 MG/2ML IJ SOLN: 4.0000 mg | Freq: Four times a day (QID) | INTRAMUSCULAR | Status: DC | PRN

## 2020-12-12 MED ORDER — OXYCODONE HCL 5 MG PO TABS
5.0000 mg | ORAL_TABLET | ORAL | Status: DC | PRN
Start: 2020-12-12 — End: 2020-12-12

## 2020-12-12 MED ORDER — DIPHENHYDRAMINE HCL 50 MG/ML IJ SOLN
INTRAMUSCULAR | Status: AC
  Administered 2020-12-12: 50 mg via INTRAVENOUS
  Filled 2020-12-12: qty 1

## 2020-12-12 MED ORDER — DIPHENHYDRAMINE HCL 50 MG/ML IJ SOLN: 50.0000 mg | Freq: Once | INTRAMUSCULAR | Status: AC | PRN

## 2020-12-12 MED ORDER — MORPHINE SULFATE (PF) 4 MG/ML IV SOLN: 2.0000 mg | INTRAVENOUS | Status: DC | PRN

## 2020-12-12 MED ORDER — SODIUM CHLORIDE 0.9% FLUSH: 3.0000 mL | Freq: Two times a day (BID) | INTRAVENOUS | Status: DC

## 2020-12-12 MED ORDER — METHYLPREDNISOLONE SODIUM SUCC 125 MG IJ SOLR: 125.0000 mg | Freq: Once | INTRAMUSCULAR | Status: AC | PRN

## 2020-12-12 MED ORDER — CEFAZOLIN SODIUM-DEXTROSE 2-4 GM/100ML-% IV SOLN
INTRAVENOUS | Status: AC
  Administered 2020-12-12: 2 g via INTRAVENOUS
  Filled 2020-12-12: qty 100

## 2020-12-12 MED ORDER — HYDRALAZINE HCL 20 MG/ML IJ SOLN: 5.0000 mg | INTRAMUSCULAR | Status: DC | PRN

## 2020-12-12 MED ORDER — SODIUM CHLORIDE 0.9% FLUSH: 3.0000 mL | INTRAVENOUS | Status: DC | PRN

## 2020-12-12 MED ORDER — ACETAMINOPHEN 325 MG PO TABS: 650.0000 mg | ORAL_TABLET | ORAL | Status: DC | PRN

## 2020-12-12 MED ORDER — MIDAZOLAM HCL 5 MG/5ML IJ SOLN
INTRAMUSCULAR | Status: AC
  Filled 2020-12-12: qty 5

## 2020-12-12 MED ORDER — LABETALOL HCL 5 MG/ML IV SOLN
10.0000 mg | INTRAVENOUS | Status: DC | PRN
Start: 2020-12-12 — End: 2020-12-12

## 2020-12-12 MED ORDER — HYDROMORPHONE HCL 1 MG/ML IJ SOLN: 1.0000 mg | Freq: Once | INTRAMUSCULAR | Status: DC | PRN

## 2020-12-12 MED ORDER — FENTANYL CITRATE (PF) 100 MCG/2ML IJ SOLN
INTRAMUSCULAR | Status: AC
  Filled 2020-12-12: qty 2

## 2020-12-12 MED ORDER — CLOPIDOGREL BISULFATE 75 MG PO TABS
ORAL_TABLET | ORAL | Status: AC
  Administered 2020-12-12: 300 mg via ORAL
  Filled 2020-12-12: qty 4

## 2020-12-12 MED ORDER — FENTANYL CITRATE (PF) 100 MCG/2ML IJ SOLN
INTRAMUSCULAR | Status: DC | PRN
  Administered 2020-12-12: 50 ug via INTRAVENOUS
  Administered 2020-12-12 (×3): 25 ug via INTRAVENOUS

## 2020-12-12 MED ORDER — ASPIRIN 325 MG PO TABS
325.0000 mg | ORAL_TABLET | ORAL | Status: DC
  Filled 2020-12-12: qty 1

## 2020-12-12 MED ORDER — METHYLPREDNISOLONE SODIUM SUCC 125 MG IJ SOLR
INTRAMUSCULAR | Status: AC
  Administered 2020-12-12: 125 mg via INTRAVENOUS
  Filled 2020-12-12: qty 2

## 2020-12-12 MED ORDER — HEPARIN SODIUM (PORCINE) 1000 UNIT/ML IJ SOLN
INTRAMUSCULAR | Status: AC
  Filled 2020-12-12: qty 1

## 2020-12-12 SURGICAL SUPPLY — 18 items
BALLN LUTONIX DCB 6X80X130 (BALLOONS) ×2
BALLOON LUTONIX DCB 6X80X130 (BALLOONS) ×1 IMPLANT
CANNULA 5F STIFF (CANNULA) ×2 IMPLANT
CATH ANGIO 5F PIGTAIL 65CM (CATHETERS) ×2 IMPLANT
COVER PROBE U/S 5X48 (MISCELLANEOUS) ×2 IMPLANT
DEVICE STARCLOSE SE CLOSURE (Vascular Products) ×4 IMPLANT
INTRODUCER 7FR 23CM (INTRODUCER) ×4 IMPLANT
KIT ENCORE 26 ADVANTAGE (KITS) ×4 IMPLANT
PACK ANGIOGRAPHY (CUSTOM PROCEDURE TRAY) ×2 IMPLANT
SHEATH BRITE TIP 5FRX11 (SHEATH) ×4 IMPLANT
STENT LIFESTAR 9X80 (Permanent Stent) ×2 IMPLANT
STENT LIFESTREAM 7X26X135 (Permanent Stent) ×2 IMPLANT
STENT LIFESTREAM 7X37X80 (Permanent Stent) ×2 IMPLANT
STENT LIFESTREAM 7X58X80 (Permanent Stent) ×2 IMPLANT
SYR MEDRAD MARK 7 150ML (SYRINGE) ×2 IMPLANT
TUBING CONTRAST HIGH PRESS 72 (TUBING) ×2 IMPLANT
WIRE GUIDERIGHT .035X150 (WIRE) ×2 IMPLANT
WIRE MAGIC TORQUE 260C (WIRE) ×4 IMPLANT

## 2020-12-12 NOTE — OR Nursing (Signed)
Itching post procedure, red blotches right leg. Dr Gilda Crease notified, contrast allergy medication given. Added allergy to chart.

## 2020-12-12 NOTE — Interval H&P Note (Signed)
History and Physical Interval Note:  12/12/2020 8:38 AM  Austin Mccarthy  has presented today for surgery, with the diagnosis of Bilateral iliac stent   BARD   ASO w claudication.  The various methods of treatment have been discussed with the patient and family. After consideration of risks, benefits and other options for treatment, the patient has consented to  Procedure(s): LOWER EXTREMITY ANGIOGRAPHY (Bilateral) as a surgical intervention.  The patient's history has been reviewed, patient examined, no change in status, stable for surgery.  I have reviewed the patient's chart and labs.  Questions were answered to the patient's satisfaction.     [name]

## 2020-12-12 NOTE — Op Note (Signed)
[facility]  Percutaneous Study/Intervention Procedural Note   Date of Surgery: 12/12/2020  Surgeon:Drequan Ironside, Dolores Lory   Pre-operative Diagnosis: Atherosclerotic occlusive disease bilateral lower extremities with lifestyle limiting claudication and mild rest pain symptoms right greater than left  Post-operative diagnosis:  Same  Procedure(s) Performed:  1.  Abdominal aortogram  2.  Bilateral distal runoff  3.  Percutaneous transluminal angioplasty and stent placement right common iliac artery; "kissing balloon" technique  4.  Percutaneous transluminal and plasty and stent placement left common iliac artery; "kissing balloon" technique.  5.  Percutaneous transluminal angioplasty right external iliac artery to 6 mm with a Lutonix drug-eluting balloon and life star stent.               6.  Ultrasound guided access bilateral common femoral arteries  7.  StarClose closure device bilateral common femoral arteries  Anesthesia: Conscious sedation was administered under my direct supervision by the interventional radiology RN. IV Versed plus fentanyl were utilized. Continuous ECG, pulse oximetry and blood pressure was monitored throughout the entire procedure. Conscious sedation was for a total of 57 minutes.  Sheath: 23 cm 7 French Pinnacle sheaths bilateral common femoral arteries retrograde  Contrast: 60 cc  Fluoroscopy Time: 5.6 minutes  Indications: Patient presented to the office with increasing leg pain.  Physical exam as well as noninvasive studies suggested aortoiliac disease.  He is undergoing [place] fee with the hope for intervention risks and benefits of been reviewed all questions been answered patient has agreed to proceed.  Procedure:  Austin Mccarthy a 60 y.o. male who was identified and appropriate procedural time out was performed.  The patient was then placed supine on the table and prepped and draped in the usual sterile fashion.  Ultrasound  was used to evaluate the right common femoral artery.  It was echolucent and pulsatile indicating it is patent .  An ultrasound image was acquired for the permanent record.  A micropuncture needle was used to access the right common femoral artery under direct ultrasound guidance.  The microwire was then advanced under fluoroscopic guidance without difficulty followed by the micro-sheath  A 0.035 J wire was advanced without resistance and a 5Fr sheath was placed.    The pigtail catheter was then positioned at the level of T12 and an AP image of the aorta was obtained. After review the images the pigtail catheter was repositioned above the aortic bifurcation and bilateral oblique views of the pelvis were obtained. Subsequently the detector was returned to the AP position and bilateral lower extremity runoff was obtained.  After review the images the ultrasound was reprepped and delivered back onto the sterile field. The left common femoral was then imaged with the ultrasound it was noted to be echolucent and pulsatile indicating patency. Images recorded for the permanent record. Under real-time visualization a microneedle was inserted into the anterior wall the common femoral artery microwire was then advanced without difficulty under fluoroscopic guidance followed by placement of the micro-sheath.  A Magic torque wire was then negotiated under fluoroscopic guidance into the aorta.  7 French sheath was then placed.  5000 units of heparin was given and allowed to circulate for proximally 4 minutes.  The right sheath was then upsized to a 7 [place] sheath as well after a Magic torque wire was advanced through the pigtail catheter. Magnified images of the aortic bifurcation were then made using hand injection contrast from the femoral sheaths. After appropriate sizing a 7 mm  x 57 mm lifestream balloon expandable stent was selected for the right and a 7 mm x 37 mm lifestream balloon expandable stent was selected  for the left. There were then advanced and positioned just above the aortic bifurcation. Insufflation for full expansion of the stents was performed simultaneously. Follow-up imaging was then performed and an extension was needed on the left and a 7 mm x 26 mm lifestream balloon expandable stent was utilized to extend the common iliac stent down to the iliac bifurcation.  The detector was then repositioned to an LAO projection and magnified imaging of the right external iliac artery was obtained.  A 9 mm x 80 mm life star stent was then deployed overlapping the existing common iliac artery stent by 1 cm and extending down into the distal external iliac artery.  The stent was then postdilated with a 6 mm x 80 mm Lutonix drug-eluting balloon inflated to 12 atm for 1 minute.  The pigtail catheter was then introduced up the right and bolus injection of contrast was used to perform final imaging of the distal aortic reconstruction.  Oblique views were then obtained of the groins in succession and Star close device is deployed without difficulty. There were no immediate complications   Findings:   Aortogram:  The abdominal aorta is opacified with a bolus injection contrast. Demonstrates diffuse disease but there are no hemodynamically significant lesions noted until the distal aortic bifurcation where bilateral greater than 80% common iliac lesions are identified.  Essentially these lesions extend from the origin of the common iliac arteries down to the iliac bifurcation bilaterally.  On the right the lesion continues across the iliac bifurcation and a greater than 80% stenosis is present in the proximal two thirds of the external iliac artery.  There is also a greater than 90% ostial stenosis of the right internal iliac artery.  There is moderate poststenotic dilatation noted of the right external iliac artery as well.  On the left the external iliac artery is mildly diseased but widely patent  Right Lower  Extremity: The right common femoral and visualized portions of the profunda femoris and superficial femoral artery are widely patent  Left Lower Extremity: The left common femoral and visualized portions of the profunda femoris and superficial femoral artery are widely patent  Following placement of the common iliac stents there is now wide patency with less than 10% residual stenosis with rapid flow through the aortic bifurcation bilaterally.  On the right the external iliac artery is now widely patent with less than 10% residual stenosis and rapid flow filling the right common femoral.  Summary:  Successful reconstruction of the distal aorta and bilateral iliac arteries  Disposition: Patient was taken to the recovery room in stable condition having tolerated the procedure well.  Belenda Cruise Thaddeus Evitts 12/12/2020,9:55 AM

## 2020-12-22 ENCOUNTER — Other Ambulatory Visit (INDEPENDENT_AMBULATORY_CARE_PROVIDER_SITE_OTHER): Payer: Self-pay | Admitting: Vascular Surgery

## 2020-12-22 DIAGNOSIS — Z95820 Peripheral vascular angioplasty status with implants and grafts: Secondary | ICD-10-CM

## 2020-12-22 DIAGNOSIS — I739 Peripheral vascular disease, unspecified: Secondary | ICD-10-CM

## 2020-12-25 ENCOUNTER — Ambulatory Visit (INDEPENDENT_AMBULATORY_CARE_PROVIDER_SITE_OTHER): Admitting: Vascular Surgery

## 2020-12-25 ENCOUNTER — Ambulatory Visit (INDEPENDENT_AMBULATORY_CARE_PROVIDER_SITE_OTHER)

## 2020-12-25 ENCOUNTER — Other Ambulatory Visit: Payer: Self-pay

## 2020-12-25 ENCOUNTER — Encounter (INDEPENDENT_AMBULATORY_CARE_PROVIDER_SITE_OTHER): Payer: Self-pay | Admitting: Vascular Surgery

## 2020-12-25 VITALS — BP 129/79 | HR 83 | Resp 16 | Wt 147.8 lb

## 2020-12-25 DIAGNOSIS — Z95820 Peripheral vascular angioplasty status with implants and grafts: Secondary | ICD-10-CM

## 2020-12-25 DIAGNOSIS — E7800 Pure hypercholesterolemia, unspecified: Secondary | ICD-10-CM

## 2020-12-25 DIAGNOSIS — M47816 Spondylosis without myelopathy or radiculopathy, lumbar region: Secondary | ICD-10-CM | POA: Diagnosis not present

## 2020-12-25 DIAGNOSIS — I739 Peripheral vascular disease, unspecified: Secondary | ICD-10-CM

## 2020-12-25 DIAGNOSIS — I10 Essential (primary) hypertension: Secondary | ICD-10-CM | POA: Diagnosis not present

## 2020-12-25 DIAGNOSIS — I70213 Atherosclerosis of native arteries of extremities with intermittent claudication, bilateral legs: Secondary | ICD-10-CM | POA: Diagnosis not present

## 2020-12-25 NOTE — Progress Notes (Signed)
MRN : [id number]  Austin Mccarthy is a 60 y.o. ([birth year]) male who presents with chief complaint of  Chief Complaint  Patient presents with   Follow-up    [facility] 2 wk le angio follow up  .  History of Present Illness:   The patient returns to the office for followup and review status post angiogram with intervention 12/12/2020.   Procedure: Percutaneous transluminal and plasty and stent placement bilateral common iliac arteries "kissing balloon" technique with percutaneous transluminal angioplasty right external iliac artery to 6 mm with a Lutonix drug-eluting balloon and life star stent  The patient notes a huge improvement in the lower extremity symptoms.  He states he was able to walk from the very back of the [place] parking lot all the way to the store without stopping which he has been able to do in years.  No interval shortening of the patient's claudication distance or rest pain symptoms. Previous wounds have now healed.  No new ulcers or wounds have occurred since the last visit.  There have been no significant changes to the patient's overall health care.  The patient denies amaurosis fugax or recent TIA symptoms. There are no recent neurological changes noted. The patient denies history of DVT, PE or superficial thrombophlebitis. The patient denies recent episodes of angina or shortness of breath.   ABI's Rt=1.11 and Lt=1.11  (previous ABI's Rt=1.16 and Lt=0.83)   Current Meds  Medication Sig   aspirin EC 81 MG tablet Take 1 tablet (81 mg total) by mouth daily. Swallow whole.   clonazePAM (KLONOPIN) 1 MG tablet Take 2 mg by mouth at bedtime.   clopidogrel (PLAVIX) 75 MG tablet Take 1 tablet (75 mg total) by mouth daily.   DULoxetine (CYMBALTA) 60 MG capsule Take 60 mg by mouth 2 (two) times daily.   folic acid (FOLVITE) 1 MG tablet Take 1 mg by mouth daily.   gabapentin (NEURONTIN) 100 MG capsule Take 100 mg by mouth daily as needed (pain).   pravastatin  (PRAVACHOL) 80 MG tablet Take 80 mg by mouth at bedtime.   triazolam (HALCION) 0.25 MG tablet Take 0.25 mg by mouth at bedtime.    Past Medical History:  Diagnosis Date   Celiac disease/sprue 08/21/2014   Chronic low back pain 12/28/2013   History of alcoholism (HCC) 05/29/2017   Hypercholesterolemia 04/08/2014   Hypertension 12/28/2013   Lumbar spondylosis 02/02/2014    Past Surgical History:  Procedure Laterality Date   GANGLION CYST EXCISION Left    foot   LOWER EXTREMITY ANGIOGRAPHY Bilateral 12/12/2020   Procedure: LOWER EXTREMITY ANGIOGRAPHY;  Surgeon: Renford Dills, MD;  Location: [facility] INVASIVE CV LAB;  Service: Cardiovascular;  Laterality: Bilateral;    Social History Social History   Tobacco Use   Smoking status: Every Day    Packs/day: 1.00    Types: Cigarettes   Smokeless tobacco: Never  Substance Use Topics   Alcohol use: No   Drug use: No    Family History Family History  Problem Relation Age of Onset   Heart attack Father    Colon cancer Father    Prostate cancer Neg Hx    Kidney cancer Neg Hx    Bladder Cancer Neg Hx     Allergies  Allergen Reactions   Contrast Media [Iodinated Diagnostic Agents] Itching and Rash   Gluten Meal Nausea Only    Other reaction(s): Abdominal Pain/ Celiac Disease     REVIEW OF SYSTEMS (Negative unless checked)  Constitutional: []   Weight loss  [] Fever  [] Chills Cardiac: [] Chest pain   [] Chest pressure   [] Palpitations   [] Shortness of breath when laying flat   [] Shortness of breath with exertion. Vascular:  [] Pain in legs with walking   [] Pain in legs at rest  [] History of DVT   [] Phlebitis   [] Swelling in legs   [] Varicose veins   [] Non-healing ulcers Pulmonary:   [] Uses home oxygen   [] Productive cough   [] Hemoptysis   [] Wheeze  [] COPD   [] Asthma Neurologic:  [] Dizziness   [] Seizures   [] History of stroke   [] History of TIA  [] Aphasia   [] Vissual changes   [] Weakness or numbness in arm   [] Weakness or numbness in  leg Musculoskeletal:   [] Joint swelling   [] Joint pain   [] Low back pain Hematologic:  [] Easy bruising  [] Easy bleeding   [] Hypercoagulable state   [] Anemic Gastrointestinal:  [] Diarrhea   [] Vomiting  [] Gastroesophageal reflux/heartburn   [] Difficulty swallowing. Genitourinary:  [] Chronic kidney disease   [] Difficult urination  [] Frequent urination   [] Blood in urine Skin:  [] Rashes   [] Ulcers  Psychological:  [] History of anxiety   []  History of major depression.  Physical Examination  Vitals:   12/25/20 1413  BP: 129/79  Pulse: 83  Resp: 16  Weight: 147 lb 12.8 oz (67 kg)   Body mass index is 23.86 kg/m. Gen: WD/WN, NAD Head: [place]/AT, No temporalis wasting.  Ear/Nose/Throat: Hearing grossly intact, nares w/o erythema or drainage Eyes: PER, EOMI, sclera nonicteric.  Neck: Supple, no large masses.   Pulmonary:  Good air movement, no audible wheezing bilaterally, no use of accessory muscles.  Cardiac: RRR, no JVD Vascular:  Vessel Right Left  Radial Palpable Palpable  PT Palpable Palpable  DP Palpable Palpable  Gastrointestinal: Non-distended. No guarding/no peritoneal signs.  Musculoskeletal: M/S 5/5 throughout.  No deformity or atrophy.  Neurologic: CN 2-12 intact. Symmetrical.  Speech is fluent. Motor exam as listed above. Psychiatric: Judgment intact, Mood & affect appropriate for pt's clinical situation. Dermatologic: No rashes or ulcers noted.  No changes consistent with cellulitis. Lymph : No lichenification or skin changes of chronic lymphedema.  CBC Lab Results  Component Value Date   WBC 11.4 (H) 12/29/2019   HGB 15.9 12/29/2019   HCT 48.0 12/29/2019   MCV 94.9 12/29/2019   PLT 273 12/29/2019    BMET    Component Value Date/Time   NA 140 12/29/2019 1611   K 4.3 12/29/2019 1611   CL 103 12/29/2019 1611   CO2 27 12/29/2019 1611   GLUCOSE 105 (H) 12/29/2019 1611   BUN 14 12/12/2020 0811   CREATININE 0.91 12/12/2020 0811   CALCIUM 8.9 12/29/2019 1611    GFRNONAA >60 12/12/2020 0811   GFRAA >60 12/29/2019 1611   Estimated Creatinine Clearance: 77.9 mL/min (by C-G formula based on SCr of 0.91 mg/dL).  COAG No results found for: INR, PROTIME  Radiology PERIPHERAL VASCULAR CATHETERIZATION  Result Date: 12/12/2020 See surgical note for result.    Assessment/Plan 1. Atherosclerosis of native artery of both lower extremities with intermittent claudication (HCC) Recommend:  The patient is status post successful angiogram with intervention.  The patient reports that the claudication symptoms and leg pain is essentially gone.   The patient denies lifestyle limiting changes at this point in time.  No further invasive studies, angiography or surgery at this time The patient should continue walking and begin a more formal exercise program.  The patient should continue antiplatelet therapy and aggressive treatment of the  lipid abnormalities  The patient should continue wearing graduated compression socks 10-15 mmHg strength to control the mild edema.  Patient should undergo noninvasive studies as ordered. The patient will follow up with me after the studies.   - VAS US AORTA/IVC/ILIACS; Future - VAS [place] ABI WITH/WO TBI; Future  2. Primary hypertension Continue antihypertensive medications as already ordered, these medications have been reviewed and there are no changes at this time.   3. Hypercholesterolemia Continue statin as ordered and reviewed, no changes at this time   4. Lumbar spondylosis Continue NSAID medications as already ordered, these medications have been reviewed and there are no changes at this time.  Continued activity and therapy was stressed.     [name], MD  12/25/2020 2:23 PM

## 2020-12-26 ENCOUNTER — Encounter (INDEPENDENT_AMBULATORY_CARE_PROVIDER_SITE_OTHER): Payer: Self-pay | Admitting: Vascular Surgery

## 2021-03-19 ENCOUNTER — Telehealth (INDEPENDENT_AMBULATORY_CARE_PROVIDER_SITE_OTHER): Payer: Self-pay | Admitting: Vascular Surgery

## 2021-03-19 NOTE — Telephone Encounter (Signed)
Called patient to be scheduled

## 2021-03-19 NOTE — Telephone Encounter (Signed)
He can come sooner with previously scheduled studies

## 2021-03-19 NOTE — Telephone Encounter (Signed)
Called stating that both legs are easily tiered and have a burning sensation. Patient states that he would like to come in to be evaluated. Patient states that the symptoms he is experiencing is the beginning stage of what was happening before he had le angio done 12/2020 (GS). Patient was last seen 12/25/20 2 wk post procedure and isnt due to come in to come in until jan 2023. Please advise.

## 2021-04-10 ENCOUNTER — Other Ambulatory Visit: Payer: Self-pay

## 2021-04-10 ENCOUNTER — Encounter (INDEPENDENT_AMBULATORY_CARE_PROVIDER_SITE_OTHER): Payer: Self-pay | Admitting: Nurse Practitioner

## 2021-04-10 ENCOUNTER — Ambulatory Visit (INDEPENDENT_AMBULATORY_CARE_PROVIDER_SITE_OTHER): Admitting: Nurse Practitioner

## 2021-04-10 ENCOUNTER — Ambulatory Visit (INDEPENDENT_AMBULATORY_CARE_PROVIDER_SITE_OTHER)

## 2021-04-10 VITALS — BP 118/77 | HR 64 | Resp 16 | Wt 149.0 lb

## 2021-04-10 DIAGNOSIS — I70213 Atherosclerosis of native arteries of extremities with intermittent claudication, bilateral legs: Secondary | ICD-10-CM

## 2021-04-10 DIAGNOSIS — E7800 Pure hypercholesterolemia, unspecified: Secondary | ICD-10-CM

## 2021-04-10 DIAGNOSIS — I10 Essential (primary) hypertension: Secondary | ICD-10-CM | POA: Diagnosis not present

## 2021-04-10 NOTE — Progress Notes (Signed)
Subjective:    Patient ID: Austin Mccarthy, male    DOB: 25-Jan-1961, 60 y.o.   MRN: [id number] Chief Complaint  Patient presents with   Follow-up    Ultrasound follow up    Austin Mccarthy is a 60 year old male that presents today due to pain and claudication-like symptoms occurring that are reminiscent of the symptoms he had prior to his procedure.  He notes that the claudication symptoms are not as severe and the distance is not as severe but he does note them currently.  He denies any rest pain or new wounds or ulcerations.  Today noninvasive studies show an ABI of 1.16 bilaterally.  Previously ABIs were 1.11.  TBI's are within normal limits.  Patient has triphasic tibial artery waveforms bilaterally.   Review of Systems  Cardiovascular:        Claudication   All other systems reviewed and are negative.     Objective:   Physical Exam Vitals reviewed.  HENT:     Head: Normocephalic.  Cardiovascular:     Rate and Rhythm: Normal rate.     Pulses: Normal pulses.  Pulmonary:     Effort: Pulmonary effort is normal.  Skin:    General: Skin is warm and dry.  Neurological:     Mental Status: He is alert and oriented to person, place, and time.  Psychiatric:        Mood and Affect: Mood normal.        Behavior: Behavior normal.        Thought Content: Thought content normal.        Judgment: Judgment normal.    BP 118/77 (BP Location: Right Arm)   Pulse 64   Resp 16   Wt 149 lb (67.6 kg)   BMI 24.05 kg/m   Past Medical History:  Diagnosis Date   Celiac disease/sprue 08/21/2014   Chronic low back pain 12/28/2013   History of alcoholism (HCC) 05/29/2017   Hypercholesterolemia 04/08/2014   Hypertension 12/28/2013   Lumbar spondylosis 02/02/2014    Social History   Socioeconomic History   Marital status: Widowed    Spouse name: Not on file   Number of children: Not on file   Years of education: Not on file   Highest education level: Not on file  Occupational  History   Not on file  Tobacco Use   Smoking status: Former    Packs/day: 1.00    Types: Cigarettes    Quit date: 03/30/2021    Years since quitting: 0.0   Smokeless tobacco: Never  Substance and Sexual Activity   Alcohol use: No   Drug use: No   Sexual activity: Not on file  Other Topics Concern   Not on file  Social History Narrative   Not on file   Social Determinants of Health   Financial Resource Strain: Not on file  Food Insecurity: Not on file  Transportation Needs: Not on file  Physical Activity: Not on file  Stress: Not on file  Social Connections: Not on file  Intimate Partner Violence: Not on file    Past Surgical History:  Procedure Laterality Date   GANGLION CYST EXCISION Left    foot   LOWER EXTREMITY ANGIOGRAPHY Bilateral 12/12/2020   Procedure: LOWER EXTREMITY ANGIOGRAPHY;  Surgeon: Renford Dills, MD;  Location: [facility];  Service: Cardiovascular;  Laterality: Bilateral;    Family History  Problem Relation Age of Onset   Heart attack Father    Colon  cancer Father    Prostate cancer Neg Hx    Kidney cancer Neg Hx    Bladder Cancer Neg Hx     Allergies  Allergen Reactions   Contrast Media [Iodinated Diagnostic Agents] Itching and Rash   Gluten Meal Nausea Only    Other reaction(s): Abdominal Pain/ Celiac Disease    CBC Latest Ref Rng & Units 12/29/2019  WBC 4.0 - 10.5 K/uL 11.4(H)  Hemoglobin 13.0 - 17.0 g/dL 15.9  Hematocrit 39.0 - 52.0 % 48.0  Platelets 150 - 400 K/uL 273      CMP     Component Value Date/Time   NA 140 12/29/2019 1611   K 4.3 12/29/2019 1611   CL 103 12/29/2019 1611   CO2 27 12/29/2019 1611   GLUCOSE 105 (H) 12/29/2019 1611   BUN 14 12/12/2020 0811   CREATININE 0.91 12/12/2020 0811   CALCIUM 8.9 12/29/2019 1611   GFRNONAA >60 12/12/2020 0811   GFRAA >60 12/29/2019 1611     No results found.     Assessment & Plan:   1. Atherosclerosis of native artery of both lower extremities with  intermittent claudication (HCC) Noninvasive studies today did not reveal evidence of worsening PAD.  We will have the patient return for short follow-up, 3 months to see if symptoms are continuing or worsening.  Patient is advised to contact us if there is any significant worsening symptoms or if new symptoms or develop.  2. Primary hypertension Continue antihypertensive medications as already ordered, these medications have been reviewed and there are no changes at this time.   3. Hypercholesterolemia Continue statin as ordered and reviewed, no changes at this time    Current Outpatient Medications on File Prior to Visit  Medication Sig Dispense Refill   aspirin EC 81 MG tablet Take 1 tablet (81 mg total) by mouth daily. Swallow whole. 150 tablet 2   clonazePAM (KLONOPIN) 1 MG tablet Take 2 mg by mouth at bedtime.  0   clopidogrel (PLAVIX) 75 MG tablet Take 1 tablet (75 mg total) by mouth daily. 30 tablet 5   DULoxetine (CYMBALTA) 60 MG capsule Take 60 mg by mouth 2 (two) times daily.     folic acid (FOLVITE) 1 MG tablet Take 1 mg by mouth daily.     gabapentin (NEURONTIN) 100 MG capsule Take 100 mg by mouth daily as needed (pain).     nicotine (NICODERM CQ) 14 mg/24hr patch Place 14 mg onto the skin daily.     pravastatin (PRAVACHOL) 80 MG tablet Take 80 mg by mouth at bedtime.     triazolam (HALCION) 0.25 MG tablet Take 0.25 mg by mouth at bedtime.     No current facility-administered medications on file prior to visit.    There are no Patient Instructions on file for this visit. No follow-ups on file.   Austin Hartmann, NP

## 2021-06-04 ENCOUNTER — Other Ambulatory Visit (INDEPENDENT_AMBULATORY_CARE_PROVIDER_SITE_OTHER): Payer: Self-pay | Admitting: Vascular Surgery

## 2021-06-28 ENCOUNTER — Encounter (INDEPENDENT_AMBULATORY_CARE_PROVIDER_SITE_OTHER)

## 2021-06-28 ENCOUNTER — Ambulatory Visit (INDEPENDENT_AMBULATORY_CARE_PROVIDER_SITE_OTHER): Admitting: Nurse Practitioner

## 2021-07-19 ENCOUNTER — Other Ambulatory Visit (INDEPENDENT_AMBULATORY_CARE_PROVIDER_SITE_OTHER): Payer: Self-pay | Admitting: Nurse Practitioner

## 2021-07-19 DIAGNOSIS — Z9889 Other specified postprocedural states: Secondary | ICD-10-CM

## 2021-07-20 ENCOUNTER — Ambulatory Visit (INDEPENDENT_AMBULATORY_CARE_PROVIDER_SITE_OTHER): Admitting: Nurse Practitioner

## 2021-07-20 ENCOUNTER — Ambulatory Visit (INDEPENDENT_AMBULATORY_CARE_PROVIDER_SITE_OTHER)

## 2021-07-20 ENCOUNTER — Other Ambulatory Visit: Payer: Self-pay

## 2021-07-20 VITALS — BP 132/79 | HR 78 | Ht 66.0 in | Wt 151.0 lb

## 2021-07-20 DIAGNOSIS — Z9889 Other specified postprocedural states: Secondary | ICD-10-CM

## 2021-07-20 DIAGNOSIS — E7800 Pure hypercholesterolemia, unspecified: Secondary | ICD-10-CM

## 2021-07-20 DIAGNOSIS — I739 Peripheral vascular disease, unspecified: Secondary | ICD-10-CM

## 2021-07-20 DIAGNOSIS — I70213 Atherosclerosis of native arteries of extremities with intermittent claudication, bilateral legs: Secondary | ICD-10-CM

## 2021-07-20 DIAGNOSIS — I10 Essential (primary) hypertension: Secondary | ICD-10-CM

## 2021-07-20 MED ORDER — CLOPIDOGREL BISULFATE 75 MG PO TABS: ORAL_TABLET | ORAL | 11 refills | Status: DC

## 2021-07-28 ENCOUNTER — Encounter (INDEPENDENT_AMBULATORY_CARE_PROVIDER_SITE_OTHER): Payer: Self-pay | Admitting: Nurse Practitioner

## 2021-07-28 NOTE — Progress Notes (Addendum)
Subjective:    Patient ID: Austin Mccarthy, male    DOB: 07-16-1960, 61 y.o.   MRN: [id number] Chief Complaint  Patient presents with   Follow-up    3 Mo BIL ABI arterial     Austin Mccarthy is a 61 year old male that returns to the office for followup and review of the noninvasive studies. There have been no interval changes in lower extremity symptoms. No interval shortening of the patient's claudication distance or development of rest pain symptoms. No new ulcers or wounds have occurred since the last visit.  There have been no significant changes to the patient's overall health care.  The patient denies amaurosis fugax or recent TIA symptoms. There are no recent neurological changes noted. The patient denies history of DVT, PE or superficial thrombophlebitis. The patient denies recent episodes of angina or shortness of breath.   ABI Rt=1.22 and Lt=1.07  (previous ABI's Rt=1.16 and Lt=1.16) Duplex ultrasound of the right lower extremity has triphasic waveforms with biphasic waveforms of the left lower extremity.  He has normal toe waveforms on the right and slightly dampened on the left.  Lower extremity duplexes bilaterally show biphasic/triphasic waveforms throughout the bilateral lower extremities.   Review of Systems  All other systems reviewed and are negative.     Objective:   Physical Exam Vitals reviewed.  HENT:     Head: Normocephalic.  Cardiovascular:     Rate and Rhythm: Normal rate.     Pulses: Decreased pulses.  Pulmonary:     Effort: Pulmonary effort is normal.  Skin:    General: Skin is warm and dry.  Neurological:     Mental Status: He is alert and oriented to person, place, and time.  Psychiatric:        Mood and Affect: Mood normal.        Behavior: Behavior normal.        Thought Content: Thought content normal.        Judgment: Judgment normal.    BP 132/79    Pulse 78    Ht 5\' 6"  (1.676 m)    Wt 151 lb (68.5 kg)    BMI 24.37 kg/m   Past  Medical History:  Diagnosis Date   Celiac disease/sprue 08/21/2014   Chronic low back pain 12/28/2013   History of alcoholism ([place]) 05/29/2017   Hypercholesterolemia 04/08/2014   Hypertension 12/28/2013   Lumbar spondylosis 02/02/2014    Social History   Socioeconomic History   Marital status: Widowed    Spouse name: Not on file   Number of children: Not on file   Years of education: Not on file   Highest education level: Not on file  Occupational History   Not on file  Tobacco Use   Smoking status: Former    Packs/day: 1.00    Types: Cigarettes    Quit date: 03/30/2021    Years since quitting: 0.3   Smokeless tobacco: Never  Substance and Sexual Activity   Alcohol use: No   Drug use: No   Sexual activity: Not on file  Other Topics Concern   Not on file  Social History Narrative   Not on file   Social Determinants of Health   Financial Resource Strain: Not on file  Food Insecurity: Not on file  Transportation Needs: Not on file  Physical Activity: Not on file  Stress: Not on file  Social Connections: Not on file  Intimate Partner Violence: Not on file    Past Surgical  History:  Procedure Laterality Date   GANGLION CYST EXCISION Left    foot   LOWER EXTREMITY ANGIOGRAPHY Bilateral 12/12/2020   Procedure: LOWER EXTREMITY ANGIOGRAPHY;  Surgeon: Katha Cabal, MD;  Location: [facility];  Service: Cardiovascular;  Laterality: Bilateral;    Family History  Problem Relation Age of Onset   Heart attack Father    Colon cancer Father    Prostate cancer Neg Hx    Kidney cancer Neg Hx    Bladder Cancer Neg Hx     Allergies  Allergen Reactions   Contrast Media [Iodinated Contrast Media] Itching and Rash   Gluten Meal Nausea Only    Other reaction(s): Abdominal Pain/ Celiac Disease    CBC Latest Ref Rng & Units 12/29/2019  WBC 4.0 - 10.5 K/uL 11.4(H)  Hemoglobin 13.0 - 17.0 g/dL 15.9  Hematocrit 39.0 - 52.0 % 48.0  Platelets 150 - 400 K/uL 273       CMP     Component Value Date/Time   NA 140 12/29/2019 1611   K 4.3 12/29/2019 1611   CL 103 12/29/2019 1611   CO2 27 12/29/2019 1611   GLUCOSE 105 (H) 12/29/2019 1611   BUN 14 12/12/2020 0811   CREATININE 0.91 12/12/2020 0811   CALCIUM 8.9 12/29/2019 1611   GFRNONAA >60 12/12/2020 0811   GFRAA >60 12/29/2019 1611     No results found.     Assessment & Plan:   1. Atherosclerosis of native artery of both lower extremities with intermittent claudication (HCC)  Recommend:  The patient has evidence of atherosclerosis of the lower extremities with claudication.  The patient does not voice lifestyle limiting changes at this point in time.  Noninvasive studies do not suggest clinically significant change.  No invasive studies, angiography or surgery at this time The patient should continue walking and begin a more formal exercise program.  The patient should continue antiplatelet therapy and aggressive treatment of the lipid abnormalities  No changes in the patient's medications at this time  The patient should continue wearing graduated compression socks 10-15 mmHg strength to control the mild edema.    2. Primary hypertension Continue antihypertensive medications as already ordered, these medications have been reviewed and there are no changes at this time.   3. Hypercholesterolemia Continue statin as ordered and reviewed, no changes at this time    Current Outpatient Medications on File Prior to Visit  Medication Sig Dispense Refill   aspirin EC 81 MG tablet Take 1 tablet (81 mg total) by mouth daily. Swallow whole. 150 tablet 2   clonazePAM (KLONOPIN) 1 MG tablet Take 2 mg by mouth at bedtime.  0   DULoxetine (CYMBALTA) 60 MG capsule Take 60 mg by mouth 2 (two) times daily.     folic acid (FOLVITE) 1 MG tablet Take 1 mg by mouth daily.     gabapentin (NEURONTIN) 100 MG capsule Take 100 mg by mouth daily as needed (pain).     nicotine (NICODERM CQ - DOSED IN  MG/24 HOURS) 14 mg/24hr patch Place 14 mg onto the skin daily.     pravastatin (PRAVACHOL) 80 MG tablet Take 80 mg by mouth at bedtime.     triazolam (HALCION) 0.25 MG tablet Take 0.25 mg by mouth at bedtime.     No current facility-administered medications on file prior to visit.    There are no Patient Instructions on file for this visit. No follow-ups on file.   Kris Hartmann, NP

## 2021-09-05 ENCOUNTER — Other Ambulatory Visit (INDEPENDENT_AMBULATORY_CARE_PROVIDER_SITE_OTHER): Payer: Self-pay | Admitting: Vascular Surgery

## 2022-01-16 ENCOUNTER — Other Ambulatory Visit (INDEPENDENT_AMBULATORY_CARE_PROVIDER_SITE_OTHER): Payer: Self-pay | Admitting: Vascular Surgery

## 2022-01-16 DIAGNOSIS — Z9889 Other specified postprocedural states: Secondary | ICD-10-CM

## 2022-01-17 ENCOUNTER — Ambulatory Visit (INDEPENDENT_AMBULATORY_CARE_PROVIDER_SITE_OTHER)

## 2022-01-17 ENCOUNTER — Ambulatory Visit (INDEPENDENT_AMBULATORY_CARE_PROVIDER_SITE_OTHER): Admitting: Nurse Practitioner

## 2022-01-17 ENCOUNTER — Encounter (INDEPENDENT_AMBULATORY_CARE_PROVIDER_SITE_OTHER): Payer: Self-pay | Admitting: Nurse Practitioner

## 2022-01-17 VITALS — BP 134/76 | HR 64 | Resp 16 | Wt 145.8 lb

## 2022-01-17 DIAGNOSIS — I70213 Atherosclerosis of native arteries of extremities with intermittent claudication, bilateral legs: Secondary | ICD-10-CM

## 2022-01-17 DIAGNOSIS — I739 Peripheral vascular disease, unspecified: Secondary | ICD-10-CM | POA: Diagnosis not present

## 2022-01-17 DIAGNOSIS — I10 Essential (primary) hypertension: Secondary | ICD-10-CM | POA: Diagnosis not present

## 2022-01-17 DIAGNOSIS — Z9889 Other specified postprocedural states: Secondary | ICD-10-CM | POA: Diagnosis not present

## 2022-01-17 DIAGNOSIS — E7800 Pure hypercholesterolemia, unspecified: Secondary | ICD-10-CM

## 2022-02-04 ENCOUNTER — Encounter (INDEPENDENT_AMBULATORY_CARE_PROVIDER_SITE_OTHER): Payer: Self-pay | Admitting: Nurse Practitioner

## 2022-02-04 NOTE — Progress Notes (Signed)
Subjective:    Patient ID: Austin Mccarthy, male    DOB: 1961/04/12, 61 y.o.   MRN: [id number] Chief Complaint  Patient presents with   Follow-up    Ultrasound follow up    The patient returns to the office for followup and review of the noninvasive studies.   There have been no interval changes in lower extremity symptoms. No interval shortening of the patient's claudication distance or development of rest pain symptoms. No new ulcers or wounds have occurred since the last visit.  There have been no significant changes to the patient's overall health care.  The patient denies amaurosis fugax or recent TIA symptoms. There are no documented recent neurological changes noted. There is no history of DVT, PE or superficial thrombophlebitis. The patient denies recent episodes of angina or shortness of breath.   ABI Rt=1.05 and Lt=1.03  (previous ABI's Rt=1.22 and Lt=1.07) Duplex ultrasound of the triphasic tibial artery waveforms in the right with biphasic in the left.  Normal toe waveforms bilaterally.    Review of Systems     Objective:   Physical Exam  BP 134/76 (BP Location: Right Arm)   Pulse 64   Resp 16   Wt 145 lb 12.8 oz (66.1 kg)   BMI 23.53 kg/m   Past Medical History:  Diagnosis Date   Celiac disease/sprue 08/21/2014   Chronic low back pain 12/28/2013   History of alcoholism (HCC) 05/29/2017   Hypercholesterolemia 04/08/2014   Hypertension 12/28/2013   Lumbar spondylosis 02/02/2014    Social History   Socioeconomic History   Marital status: Widowed    Spouse name: Not on file   Number of children: Not on file   Years of education: Not on file   Highest education level: Not on file  Occupational History   Not on file  Tobacco Use   Smoking status: Former    Packs/day: 1.00    Types: Cigarettes    Quit date: 03/30/2021    Years since quitting: 0.8   Smokeless tobacco: Never  Substance and Sexual Activity   Alcohol use: No   Drug use: No   Sexual  activity: Not on file  Other Topics Concern   Not on file  Social History Narrative   Not on file   Social Determinants of Health   Financial Resource Strain: Not on file  Food Insecurity: Not on file  Transportation Needs: Not on file  Physical Activity: Not on file  Stress: Not on file  Social Connections: Not on file  Intimate Partner Violence: Not on file    Past Surgical History:  Procedure Laterality Date   GANGLION CYST EXCISION Left    foot   LOWER EXTREMITY ANGIOGRAPHY Bilateral 12/12/2020   Procedure: LOWER EXTREMITY ANGIOGRAPHY;  Surgeon: Renford Dills, MD;  Location: [facility];  Service: Cardiovascular;  Laterality: Bilateral;    Family History  Problem Relation Age of Onset   Heart attack Father    Colon cancer Father    Prostate cancer Neg Hx    Kidney cancer Neg Hx    Bladder Cancer Neg Hx     Allergies  Allergen Reactions   Contrast Media [Iodinated Contrast Media] Itching and Rash   Gluten Meal Nausea Only    Other reaction(s): Abdominal Pain/ Celiac Disease       Latest Ref Rng & Units 12/29/2019    4:11 PM  CBC  WBC 4.0 - 10.5 K/uL 11.4   Hemoglobin 13.0 - 17.0  g/dL 34.1   Hematocrit 93.7 - 52.0 % 48.0   Platelets 150 - 400 K/uL 273       CMP     Component Value Date/Time   NA 140 12/29/2019 1611   K 4.3 12/29/2019 1611   CL 103 12/29/2019 1611   CO2 27 12/29/2019 1611   GLUCOSE 105 (H) 12/29/2019 1611   BUN 14 12/12/2020 0811   CREATININE 0.91 12/12/2020 0811   CALCIUM 8.9 12/29/2019 1611   GFRNONAA >60 12/12/2020 0811   GFRAA >60 12/29/2019 1611     VAS [place] ABI WITH/WO TBI  Result Date: 01/17/2022  LOWER EXTREMITY DOPPLER STUDY Patient Name:  Austin Mccarthy  Date of Exam:   01/17/2022 Medical Rec #: [id number]            Accession #:    [id number] Date of Birth: 02-05-1961            Patient Gender: M Patient Age:   61 years Exam Location:  [facility] Procedure:      VAS [place] ABI WITH/WO TBI  Referring Phys: [name] --------------------------------------------------------------------------------  Indications: Peripheral artery disease.  Vascular Interventions: 12/12/2020: Abdominal Aortogram. Bilateral Distal                         Runoff. PTA and Stent placement Right Iliac                         Artery"Kissing Balloon" technique. PTA and Stent                         placement Left Common Iliac Artery; "Kissing Balloon"                         technique.PTA Rt External Iliac Artery to 61mm with a                         Lutonix drug eluding balloon and Life star stent. Comparison Study: 07/20/2021 Performing Technologist: Debbe Bales RVS  Examination Guidelines: A complete evaluation includes at minimum, Doppler waveform signals and systolic blood pressure reading at the level of bilateral brachial, anterior tibial, and posterior tibial arteries, when vessel segments are accessible. Bilateral testing is considered an integral part of a complete examination. Photoelectric Plethysmograph (PPG) waveforms and toe systolic pressure readings are included as required and additional duplex testing as needed. Limited examinations for reoccurring indications may be performed as noted.  ABI Findings: +---------+------------------+-----+---------+--------+ Right    Rt Pressure (mmHg)IndexWaveform Comment  +---------+------------------+-----+---------+--------+ Brachial 124                                      +---------+------------------+-----+---------+--------+ ATA      125               0.98 triphasic         +---------+------------------+-----+---------+--------+ PTA      135               1.05 triphasic         +---------+------------------+-----+---------+--------+ Great Toe114               0.89 Normal            +---------+------------------+-----+---------+--------+ +---------+------------------+-----+---------+-------+ Left     Lt  Pressure  (mmHg)IndexWaveform Comment +---------+------------------+-----+---------+-------+ Brachial 128                                     +---------+------------------+-----+---------+-------+ ATA      132               1.03 triphasic        +---------+------------------+-----+---------+-------+ PTA      131               1.02 triphasic        +---------+------------------+-----+---------+-------+ Great Toe116               0.91 Normal           +---------+------------------+-----+---------+-------+ +-------+-----------+-----------+------------+------------+ ABI/TBIToday's ABIToday's TBIPrevious ABIPrevious TBI +-------+-----------+-----------+------------+------------+ Right  1.05       .89        1.22        1.10         +-------+-----------+-----------+------------+------------+ Left   1.03       .91        1.07        1.0          +-------+-----------+-----------+------------+------------+ Bilateral ABIs appear essentially unchanged compared to prior study on 07/20/2021. Bilateral TBIs appear decreased compared to prior study on 07/20/2021.  Summary: Right: Resting right ankle-brachial index is within normal range. The right toe-brachial index is normal. Left: Resting left ankle-brachial index is within normal range. The left toe-brachial index is normal. *See table(s) above for measurements and observations.   Electronically signed by [name] MD on 01/17/2022 at 4:39:21 PM.    Final        Assessment & Plan:   1. Atherosclerosis of native artery of both lower extremities with intermittent claudication (HCC)  Recommend:  The patient has evidence of atherosclerosis of the lower extremities.  The patient does not voice lifestyle limiting changes at this point in time.  Noninvasive studies do not suggest clinically significant change.  No invasive studies, angiography or surgery at this time The patient should continue walking and begin a more formal  exercise program.  The patient should continue antiplatelet therapy and aggressive treatment of the lipid abnormalities  No changes in the patient's medications at this time  Continued surveillance is indicated as atherosclerosis is likely to progress with time.    The patient will continue follow up with noninvasive studies as ordered.    2. Primary hypertension Continue antihypertensive medications as already ordered, these medications have been reviewed and there are no changes at this time.   3. Hypercholesterolemia Continue statin as ordered and reviewed, no changes at this time    Current Outpatient Medications on File Prior to Visit  Medication Sig Dispense Refill   ASPIRIN LOW DOSE 81 MG EC tablet TAKE 1 TABLET BY MOUTH DAILY SWALLOW WHOLE 150 tablet 2   clonazePAM (KLONOPIN) 1 MG tablet Take 2 mg by mouth at bedtime.  0   clopidogrel (PLAVIX) 75 MG tablet TAKE 1 TABLET(75 MG) BY MOUTH DAILY 30 tablet 11   DULoxetine (CYMBALTA) 60 MG capsule Take 60 mg by mouth 2 (two) times daily.     folic acid (FOLVITE) 1 MG tablet Take 1 mg by mouth daily.     gabapentin (NEURONTIN) 100 MG capsule Take 100 mg by mouth daily as needed (pain).     nicotine (NICODERM CQ - DOSED IN MG/24 HOURS) 14 mg/24hr patch  Place 14 mg onto the skin daily.     pravastatin (PRAVACHOL) 80 MG tablet Take 80 mg by mouth at bedtime.     triazolam (HALCION) 0.25 MG tablet Take 0.25 mg by mouth at bedtime.     No current facility-administered medications on file prior to visit.    There are no Patient Instructions on file for this visit. No follow-ups on file.   Georgiana Spinner, NP

## 2022-02-14 IMAGING — MR MR LUMBAR SPINE W/O CM
5 series · 31 of 48 positions shown · non-contrast
Comparison: None.

CLINICAL DATA: Low back pain, leg pain for years.

EXAM:
MRI LUMBAR SPINE WITHOUT CONTRAST
TECHNIQUE: Multiplanar, multisequence MR imaging of the lumbar spine was
performed. No intravenous contrast was administered.

[Series 5: T2 · sagittal · 4.0mm · 0.81mm/px · 7 of 17 slices shown (1 of 2)]
[im 1/17]
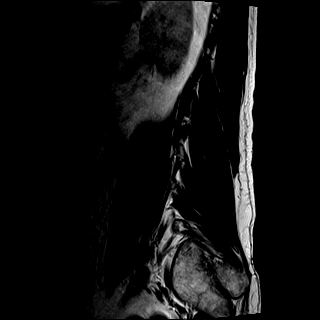
[im 3/17]
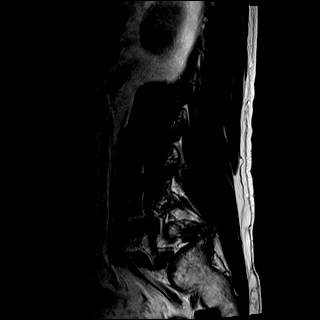
[im 6/17]
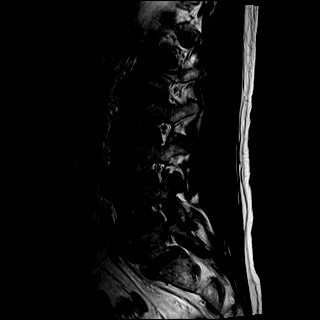
[im 9/17]
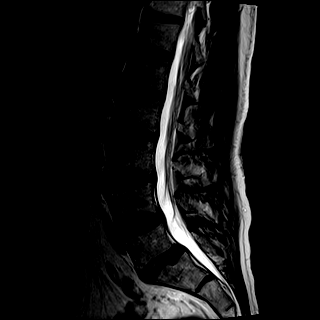
[im 11/17]
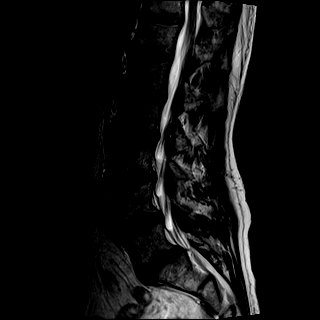
[im 14/17]
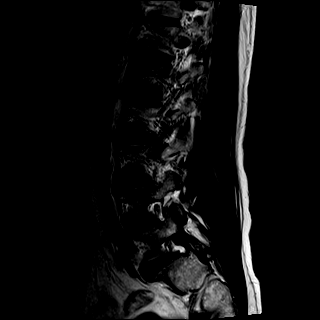
[im 17/17]
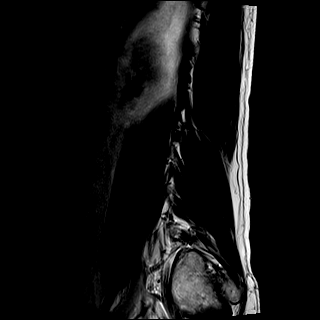

[Series 6: T1 · sagittal · 4.0mm · 0.81mm/px · 7 of 17 slices shown (1 of 2)]
[im 1/17]
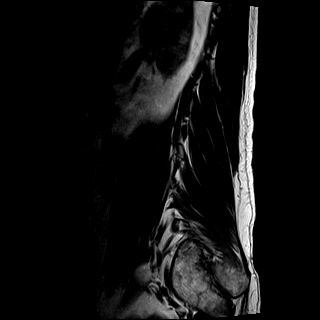
[im 3/17]
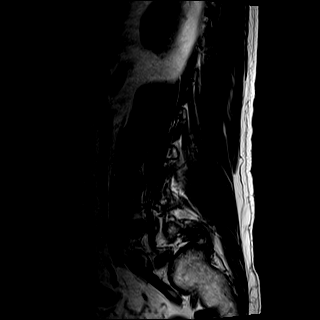
[im 6/17]
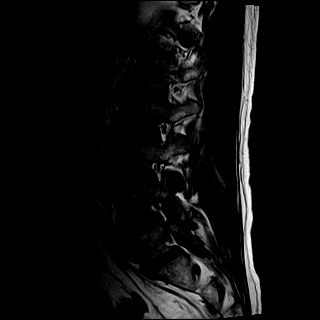
[im 9/17]
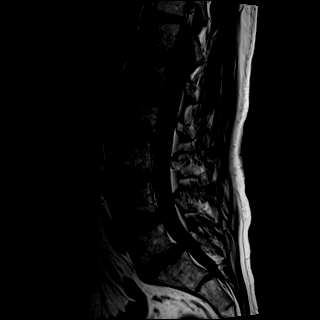
[im 11/17]
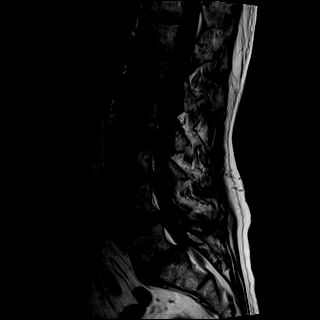
[im 14/17]
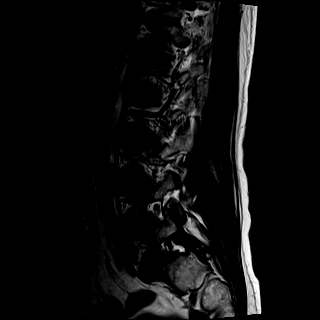
[im 17/17]
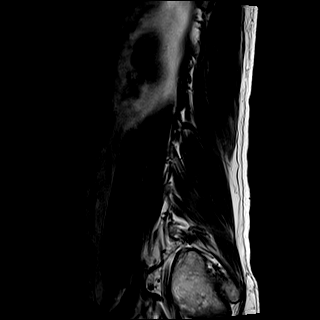

[Series 7: STIR · sagittal · 4.0mm · 0.41mm/px · 1 of 17 slices shown]
[im 1/17]
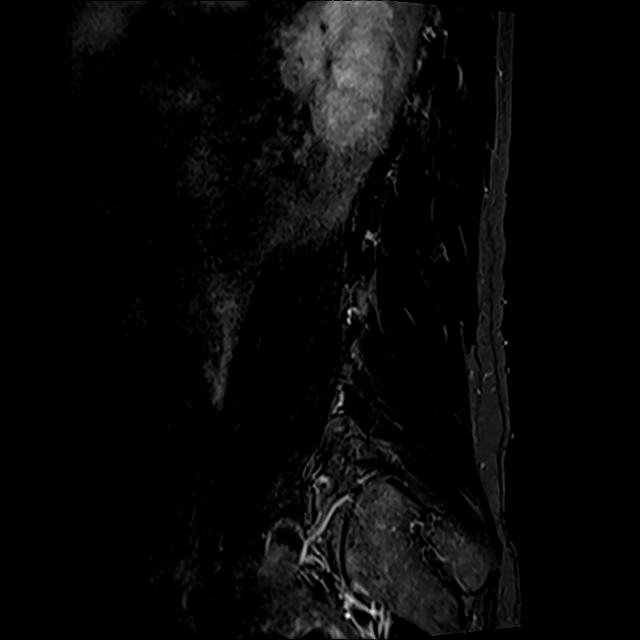

[Series 8: T2 · axial · 4.0mm · 0.78mm/px · z∈[-116,+98]mm · 8 of 38 slices shown (2 of 2)]
[im 1/38]
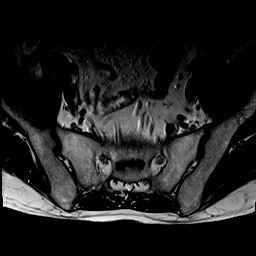
[im 6/38]
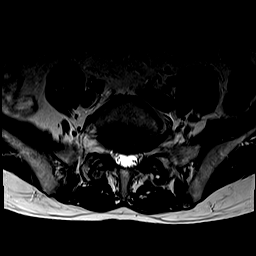
[im 12/38]
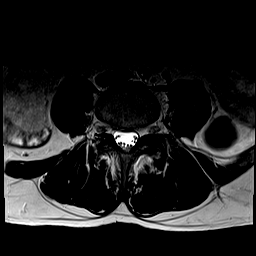
[im 18/38]
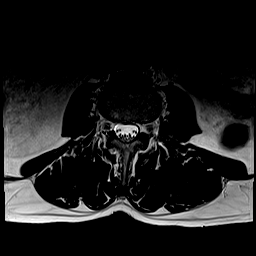
[im 20/38]
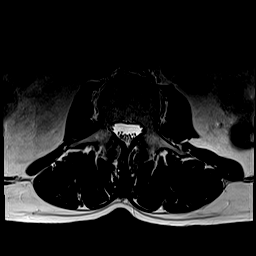
[im 26/38]
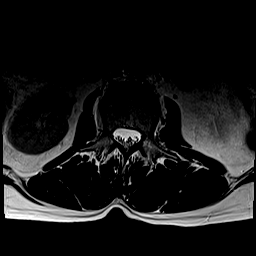
[im 32/38]
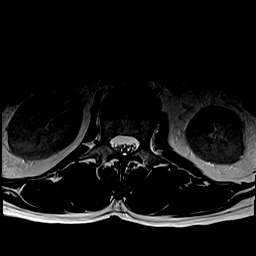
[im 38/38]
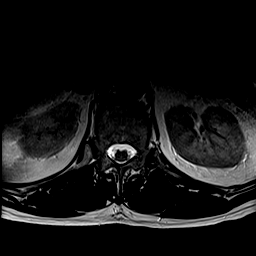

[Series 9: T1 · axial · 4.0mm · 0.39mm/px · z∈[-116,+98]mm · 8 of 38 slices shown (2 of 2)]
[im 1/38]
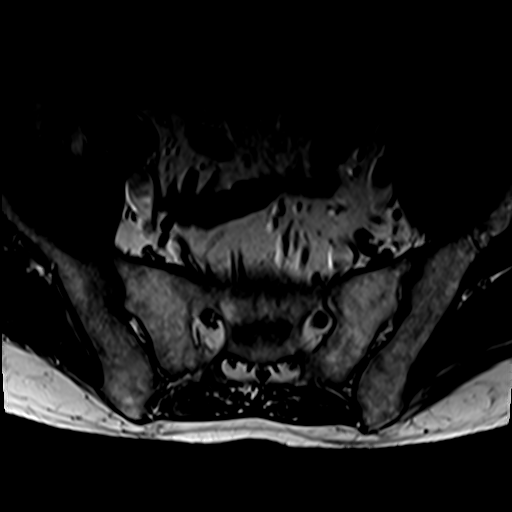
[im 6/38]
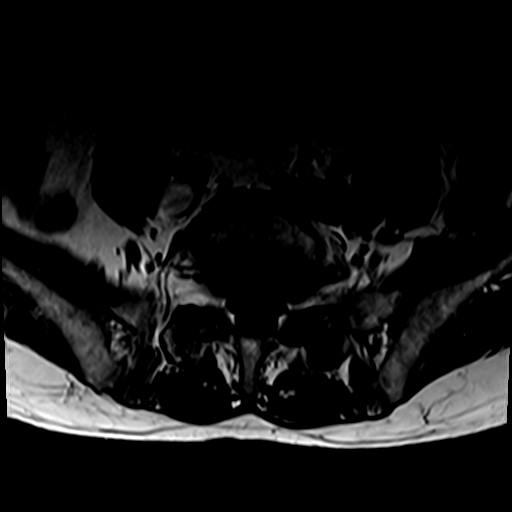
[im 12/38]
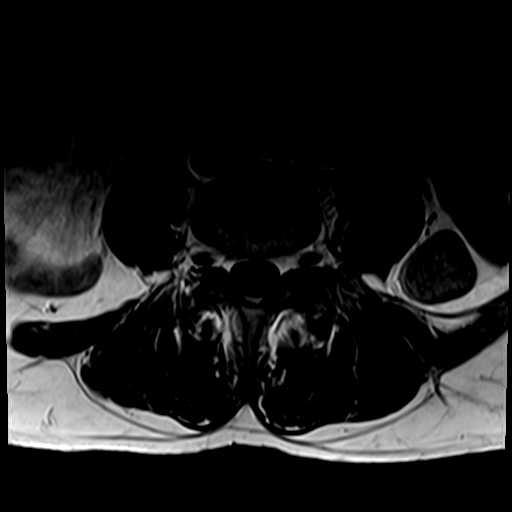
[im 18/38]
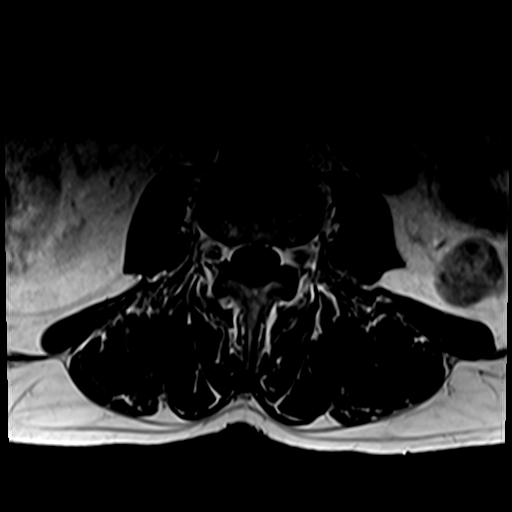
[im 20/38]
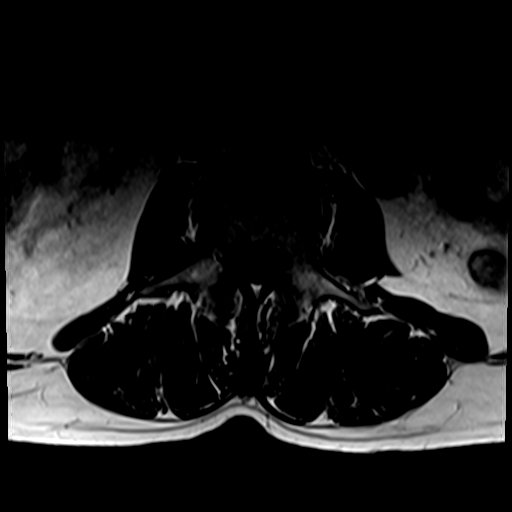
[im 26/38]
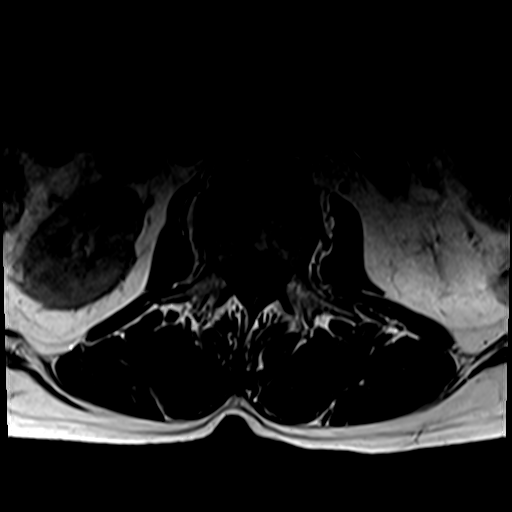
[im 32/38]
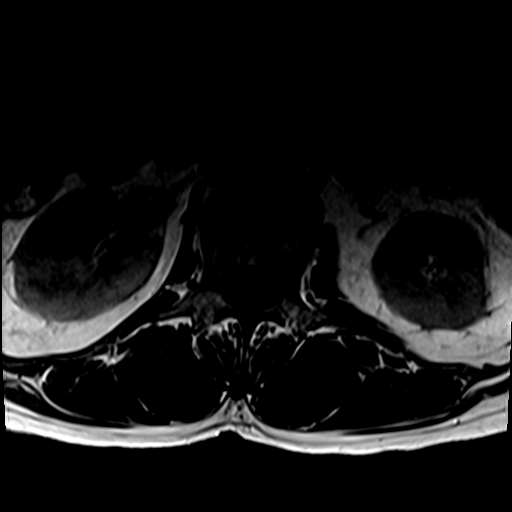
[im 38/38]
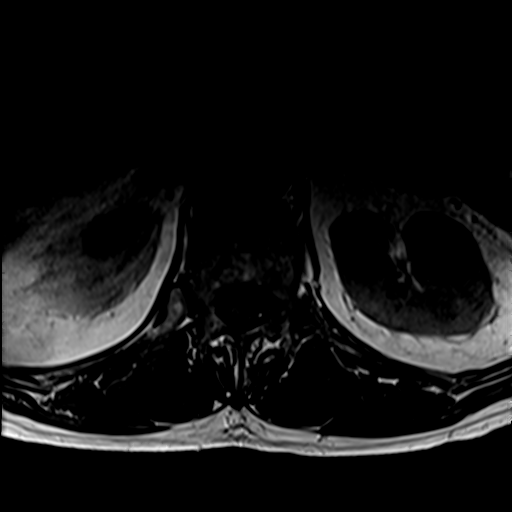

[31 of 48 positions shown; findings below may reference images not displayed]

FINDINGS: Segmentation:  Standard.

Alignment:  Minimal grade 1 anterolisthesis of L5 on S1.

Vertebrae:  No fracture, evidence of discitis, or bone lesion.

Conus medullaris and cauda equina: Conus extends to the L1 level.
Conus and cauda equina appear normal.

Paraspinal and other soft tissues: No acute paraspinal abnormality.

Disc levels:

Disc spaces: Disc desiccation throughout the lumbar spine. Mild disc
height loss at L5-S1.

T12-L1: No significant disc bulge. No evidence of neural foraminal
stenosis. No central canal stenosis.

L1-L2: Minimal broad-based disc bulge. No evidence of neural
foraminal stenosis. No central canal stenosis.

L2-L3: Mild broad-based disc bulge. No evidence of neural foraminal
stenosis. No central canal stenosis.

L3-L4: Minimal broad-based disc bulge. No evidence of neural
foraminal stenosis. No central canal stenosis.

L4-L5: Minimal broad-based disc bulge. Moderate left and mild right
facet arthropathy. Mild left foraminal stenosis. No right foraminal
stenosis. No central canal stenosis.

L5-S1: Broad-based disc bulge. Severe bilateral facet arthropathy.
Mild left foraminal stenosis. No right foraminal stenosis. No
central canal stenosis.
IMPRESSION: 1. At L4-5 there is a minimal broad-based disc bulge. Moderate left
and mild right facet arthropathy. Mild left foraminal stenosis.
2. At L5-S1 there is a broad-based disc bulge. Severe bilateral
facet arthropathy. Mild left foraminal stenosis.

## 2022-04-01 ENCOUNTER — Encounter (INDEPENDENT_AMBULATORY_CARE_PROVIDER_SITE_OTHER): Payer: Self-pay

## 2022-08-16 ENCOUNTER — Other Ambulatory Visit (INDEPENDENT_AMBULATORY_CARE_PROVIDER_SITE_OTHER): Payer: Self-pay | Admitting: Vascular Surgery

## 2023-01-15 ENCOUNTER — Other Ambulatory Visit (INDEPENDENT_AMBULATORY_CARE_PROVIDER_SITE_OTHER): Payer: Self-pay | Admitting: Nurse Practitioner

## 2023-01-15 DIAGNOSIS — Z9889 Other specified postprocedural states: Secondary | ICD-10-CM

## 2023-01-17 ENCOUNTER — Ambulatory Visit (INDEPENDENT_AMBULATORY_CARE_PROVIDER_SITE_OTHER)

## 2023-01-17 ENCOUNTER — Ambulatory Visit (INDEPENDENT_AMBULATORY_CARE_PROVIDER_SITE_OTHER): Admitting: Nurse Practitioner

## 2023-01-17 ENCOUNTER — Encounter (INDEPENDENT_AMBULATORY_CARE_PROVIDER_SITE_OTHER): Payer: Self-pay | Admitting: Nurse Practitioner

## 2023-01-17 VITALS — BP 153/81 | HR 65 | Resp 18 | Ht 66.0 in | Wt 154.4 lb

## 2023-01-17 DIAGNOSIS — I739 Peripheral vascular disease, unspecified: Secondary | ICD-10-CM

## 2023-01-17 DIAGNOSIS — Z9889 Other specified postprocedural states: Secondary | ICD-10-CM | POA: Diagnosis not present

## 2023-01-20 LAB — VAS US ABI WITH/WO TBI
Left ABI: 1.12
Right ABI: 1.04

## 2023-01-20 NOTE — Progress Notes (Signed)
Subjective:    Patient ID: Austin Mccarthy, male    DOB: Nov 28, 1960, 62 y.o.   MRN: [id number] Chief Complaint  Patient presents with   Follow-up    Follow up- 1 year abi     The patient returns to the office for followup and review of the noninvasive studies.   The patient notes he has been doing well.  He did have some slight claudication but nothing significant or enough to stop his daily activities.  No new ulcers or wounds have occurred since the last visit.  There have been no significant changes to the patient's overall health care.  The patient denies amaurosis fugax or recent TIA symptoms. There are no documented recent neurological changes noted. There is no history of DVT, PE or superficial thrombophlebitis. The patient denies recent episodes of angina or shortness of breath.   ABI Rt=1.04 and Lt=1.12  (previous ABI's Rt=1.05 and Lt=1.03) Duplex ultrasound of tibial vessels shows biphasic waveforms in the right and triphasic tibial vessels in the left.  Patient still has strong toe waveforms bilaterally.    Review of Systems  Cardiovascular:  Negative for leg swelling.  All other systems reviewed and are negative.      Objective:   Physical Exam Vitals reviewed.  HENT:     Head: Normocephalic.  Cardiovascular:     Rate and Rhythm: Normal rate.     Pulses:          Dorsalis pedis pulses are detected w/ Doppler on the right side and detected w/ Doppler on the left side.       Posterior tibial pulses are detected w/ Doppler on the right side and detected w/ Doppler on the left side.  Pulmonary:     Effort: Pulmonary effort is normal.  Skin:    General: Skin is warm and dry.  Neurological:     Mental Status: He is alert and oriented to person, place, and time.  Psychiatric:        Mood and Affect: Mood normal.        Behavior: Behavior normal.        Thought Content: Thought content normal.        Judgment: Judgment normal.     BP (!) 153/81 (BP  Location: Left Arm)   Pulse 65   Resp 18   Ht 5\' 6"  (1.676 m)   Wt 154 lb 6.4 oz (70 kg)   BMI 24.92 kg/m   Past Medical History:  Diagnosis Date   Celiac disease/sprue 08/21/2014   Chronic low back pain 12/28/2013   History of alcoholism (HCC) 05/29/2017   Hypercholesterolemia 04/08/2014   Hypertension 12/28/2013   Lumbar spondylosis 02/02/2014    Social History   Socioeconomic History   Marital status: Widowed    Spouse name: Not on file   Number of children: Not on file   Years of education: Not on file   Highest education level: Not on file  Occupational History   Not on file  Tobacco Use   Smoking status: Former    Current packs/day: 0.00    Types: Cigarettes    Quit date: 03/30/2021    Years since quitting: 1.8   Smokeless tobacco: Never  Substance and Sexual Activity   Alcohol use: No   Drug use: No   Sexual activity: Not on file  Other Topics Concern   Not on file  Social History Narrative   Not on file   Social Determinants of Health  Financial Resource Strain: Not on file  Food Insecurity: Not on file  Transportation Needs: Not on file  Physical Activity: Not on file  Stress: Not on file  Social Connections: Not on file  Intimate Partner Violence: Not on file    Past Surgical History:  Procedure Laterality Date   GANGLION CYST EXCISION Left    foot   LOWER EXTREMITY ANGIOGRAPHY Bilateral 12/12/2020   Procedure: LOWER EXTREMITY ANGIOGRAPHY;  Surgeon: Renford Dills, MD;  Location: [facility] IN[facility]IVE [facility];  Service: Cardiovascular;  Laterality: Bilateral;    Family History  Problem Relation Age of Onset   Heart attack Father    Colon cancer Father    Prostate cancer Neg Hx    Kidney cancer Neg Hx    Bladder Cancer Neg Hx     Allergies  Allergen Reactions   Contrast Media [Iodinated Contrast Media] Itching and Rash   Gluten Meal Nausea Only    Other reaction(s): Abdominal Pain/ Celiac Disease       Latest Ref Rng & Units 12/29/2019     4:11 PM  CBC  WBC 4.0 - 10.5 K/uL 11.4   Hemoglobin 13.0 - 17.0 g/dL 08.6   Hematocrit 57.8 - 52.0 % 48.0   Platelets 150 - 400 K/uL 273       CMP     Component Value Date/Time   NA 140 12/29/2019 1611   K 4.3 12/29/2019 1611   CL 103 12/29/2019 1611   CO2 27 12/29/2019 1611   GLUCOSE 105 (H) 12/29/2019 1611   BUN 14 12/12/2020 0811   CREATININE 0.91 12/12/2020 0811   CALCIUM 8.9 12/29/2019 1611   GFRNONAA >60 12/12/2020 0811   GFRAA >60 12/29/2019 1611     VAS [place] ABI WITH/WO TBI  Result Date: 01/17/2022  LOWER EXTREMITY DOPPLER STUDY Patient Name:  Austin Mccarthy  Date of Exam:   01/17/2022 Medical Rec #: [id number]            Accession #:    [id number] Date of Birth: [birth year]            Patient Gender: M Patient Age:   1 years Exam Location:  [facility] Procedure:      VAS [place] ABI WITH/WO TBI Referring Phys: [name] --------------------------------------------------------------------------------  Indications: Peripheral artery disease.  Vascular Interventions: 12/12/2020: Abdominal Aortogram. Bilateral Distal                         Runoff. PTA and Stent placement Right Iliac                         Artery"Kissing Balloon" technique. PTA and Stent                         placement Left Common Iliac Artery; "Kissing Balloon"                         technique.PTA Rt External Iliac Artery to 6mm with a                         Lutonix drug eluding balloon and Life star stent. Comparison Study: 07/20/2021 Performing Technologist: Debbe Bales RVS  Examination Guidelines: A complete evaluation includes at minimum, Doppler waveform signals and systolic blood pressure reading at the level of bilateral brachial, anterior tibial, and posterior tibial  arteries, when vessel segments are accessible. Bilateral testing is considered an integral part of a complete examination. Photoelectric Plethysmograph (PPG) waveforms and toe systolic pressure readings are included as  required and additional duplex testing as needed. Limited examinations for reoccurring indications may be performed as noted.  ABI Findings: +---------+------------------+-----+---------+--------+ Right    Rt Pressure (mmHg)IndexWaveform Comment  +---------+------------------+-----+---------+--------+ Brachial 124                                      +---------+------------------+-----+---------+--------+ ATA      125               0.98 triphasic         +---------+------------------+-----+---------+--------+ PTA      135               1.05 triphasic         +---------+------------------+-----+---------+--------+ Great Toe114               0.89 Normal            +---------+------------------+-----+---------+--------+ +---------+------------------+-----+---------+-------+ Left     Lt Pressure (mmHg)IndexWaveform Comment +---------+------------------+-----+---------+-------+ Brachial 128                                     +---------+------------------+-----+---------+-------+ ATA      132               1.03 triphasic        +---------+------------------+-----+---------+-------+ PTA      131               1.02 triphasic        +---------+------------------+-----+---------+-------+ Great Toe116               0.91 Normal           +---------+------------------+-----+---------+-------+ +-------+-----------+-----------+------------+------------+ ABI/TBIToday's ABIToday's TBIPrevious ABIPrevious TBI +-------+-----------+-----------+------------+------------+ Right  1.05       .89        1.22        1.10         +-------+-----------+-----------+------------+------------+ Left   1.03       .91        1.07        1.0          +-------+-----------+-----------+------------+------------+ Bilateral ABIs appear essentially unchanged compared to prior study on 07/20/2021. Bilateral TBIs appear decreased compared to prior study on 07/20/2021.  Summary:  Right: Resting right ankle-brachial index is within normal range. The right toe-brachial index is normal. Left: Resting left ankle-brachial index is within normal range. The left toe-brachial index is normal. *See table(s) above for measurements and observations.   Electronically signed by [name] MD on 01/17/2022 at 4:39:21 PM.    Final        Assessment & Plan:   1. Atherosclerosis of native artery of both lower extremities with intermittent claudication (HCC)  Recommend:  The patient has evidence of atherosclerosis of the lower extremities.  The patient does not voice lifestyle limiting changes at this point in time.  Noninvasive studies do not suggest clinically significant change.  No invasive studies, angiography or surgery at this time The patient should continue walking and begin a more formal exercise program.  The patient should continue antiplatelet therapy and aggressive treatment of the lipid abnormalities  No changes in the patient's medications at this time  Continued surveillance is indicated as atherosclerosis is likely to progress with time.    The patient will continue follow up with noninvasive studies as ordered.    2. Primary hypertension Continue antihypertensive medications as already ordered, these medications have been reviewed and there are no changes at this time.   3. Hypercholesterolemia Continue statin as ordered and reviewed, no changes at this time    Current Outpatient Medications on File Prior to Visit  Medication Sig Dispense Refill   ASPIRIN LOW DOSE 81 MG EC tablet TAKE 1 TABLET BY MOUTH DAILY SWALLOW WHOLE 150 tablet 2   buPROPion (WELLBUTRIN SR) 150 MG 12 hr tablet Take 150 mg by mouth 2 (two) times daily.     clonazePAM (KLONOPIN) 1 MG tablet Take 2 mg by mouth at bedtime.  0   clopidogrel (PLAVIX) 75 MG tablet TAKE 1 TABLET(75 MG) BY MOUTH DAILY 90 tablet 3   DULoxetine (CYMBALTA) 60 MG capsule Take 60 mg by mouth 2 (two) times  daily.     pravastatin (PRAVACHOL) 80 MG tablet Take 80 mg by mouth at bedtime.     tadalafil (CIALIS) 20 MG tablet Take 20 mg by mouth daily as needed.     No current facility-administered medications on file prior to visit.    There are no Patient Instructions on file for this visit. No follow-ups on file.   Georgiana Spinner, NP

## 2023-06-18 ENCOUNTER — Other Ambulatory Visit: Payer: Self-pay | Admitting: Internal Medicine

## 2023-06-18 DIAGNOSIS — I10 Essential (primary) hypertension: Secondary | ICD-10-CM

## 2023-06-18 DIAGNOSIS — F17210 Nicotine dependence, cigarettes, uncomplicated: Secondary | ICD-10-CM

## 2023-06-25 ENCOUNTER — Ambulatory Visit
Admission: RE | Admit: 2023-06-25 | Discharge: 2023-06-25 | Disposition: A | Discharge status: Home / Self Care | Source: Ambulatory Visit | Attending: Internal Medicine | Admitting: Internal Medicine

## 2023-06-25 DIAGNOSIS — I10 Essential (primary) hypertension: Secondary | ICD-10-CM | POA: Insufficient documentation

## 2023-06-25 DIAGNOSIS — F17210 Nicotine dependence, cigarettes, uncomplicated: Secondary | ICD-10-CM | POA: Insufficient documentation

## 2023-06-25 DIAGNOSIS — I700 Atherosclerosis of aorta: Secondary | ICD-10-CM | POA: Insufficient documentation

## 2023-06-25 DIAGNOSIS — J439 Emphysema, unspecified: Secondary | ICD-10-CM | POA: Insufficient documentation

## 2023-06-25 DIAGNOSIS — Z122 Encounter for screening for malignant neoplasm of respiratory organs: Secondary | ICD-10-CM | POA: Insufficient documentation

## 2023-07-08 ENCOUNTER — Encounter: Payer: Self-pay | Admitting: Internal Medicine

## 2023-07-09 ENCOUNTER — Encounter: Payer: Self-pay | Admitting: Internal Medicine

## 2023-07-09 ENCOUNTER — Telehealth (INDEPENDENT_AMBULATORY_CARE_PROVIDER_SITE_OTHER): Payer: Self-pay | Admitting: Nurse Practitioner

## 2023-07-09 NOTE — Telephone Encounter (Signed)
 He is ok to hold plavix  from our perspective

## 2023-07-09 NOTE — Telephone Encounter (Signed)
 New message   Cardiac clearance  The patient has a colonoscopy scheduled for 07/16/2023.   Austin Mccarthy at [facility] needs a callback today or tomorrow regarding the five mark.   The patient should discontinue taking Plavix  for five days before the procedure please advise.

## 2023-07-10 NOTE — Telephone Encounter (Signed)
 [facility] was notified with approval and blood thinner request will be fax to the office

## 2023-07-15 ENCOUNTER — Encounter: Payer: Self-pay | Admitting: Internal Medicine

## 2023-07-16 ENCOUNTER — Ambulatory Visit: Admitting: Anesthesiology

## 2023-07-16 ENCOUNTER — Encounter: Payer: Self-pay | Admitting: Internal Medicine

## 2023-07-16 ENCOUNTER — Ambulatory Visit
Admission: RE | Admit: 2023-07-16 | Discharge: 2023-07-16 | Disposition: A | Discharge status: Home / Self Care | Attending: Internal Medicine | Admitting: Internal Medicine

## 2023-07-16 ENCOUNTER — Encounter
Admission: RE | Disposition: A | Discharge status: Home / Self Care | Payer: Self-pay | Source: Home / Self Care | Attending: Internal Medicine

## 2023-07-16 DIAGNOSIS — Z7902 Long term (current) use of antithrombotics/antiplatelets: Secondary | ICD-10-CM | POA: Diagnosis not present

## 2023-07-16 DIAGNOSIS — Z87891 Personal history of nicotine dependence: Secondary | ICD-10-CM | POA: Diagnosis not present

## 2023-07-16 DIAGNOSIS — K640 First degree hemorrhoids: Secondary | ICD-10-CM | POA: Diagnosis not present

## 2023-07-16 DIAGNOSIS — Z1211 Encounter for screening for malignant neoplasm of colon: Secondary | ICD-10-CM | POA: Insufficient documentation

## 2023-07-16 DIAGNOSIS — I10 Essential (primary) hypertension: Secondary | ICD-10-CM | POA: Insufficient documentation

## 2023-07-16 DIAGNOSIS — Z860101 Personal history of adenomatous and serrated colon polyps: Secondary | ICD-10-CM | POA: Insufficient documentation

## 2023-07-16 HISTORY — PX: COLONOSCOPY WITH PROPOFOL: SHX5780

## 2023-07-16 HISTORY — DX: Anemia, unspecified: D64.9

## 2023-07-16 HISTORY — DX: Deficiency of other specified B group vitamins: E53.8

## 2023-07-16 SURGERY — COLONOSCOPY WITH PROPOFOL
Anesthesia: General

## 2023-07-16 MED ORDER — SODIUM CHLORIDE 0.9 % IV SOLN: INTRAVENOUS | Status: DC

## 2023-07-16 MED ORDER — DEXMEDETOMIDINE HCL IN NACL 80 MCG/20ML IV SOLN
INTRAVENOUS | Status: DC | PRN
  Administered 2023-07-16: 8 ug via INTRAVENOUS

## 2023-07-16 MED ORDER — PROPOFOL 10 MG/ML IV BOLUS
INTRAVENOUS | Status: DC | PRN
  Administered 2023-07-16: 40 mg via INTRAVENOUS
  Administered 2023-07-16: 50 mg via INTRAVENOUS
  Administered 2023-07-16: 10 mg via INTRAVENOUS

## 2023-07-16 MED ORDER — PROPOFOL 500 MG/50ML IV EMUL
INTRAVENOUS | Status: DC | PRN
  Administered 2023-07-16: 100 ug/kg/min via INTRAVENOUS

## 2023-07-16 MED ORDER — PROPOFOL 10 MG/ML IV BOLUS
INTRAVENOUS | Status: AC
  Filled 2023-07-16: qty 20

## 2023-07-16 MED ORDER — LIDOCAINE HCL (PF) 2 % IJ SOLN
INTRAMUSCULAR | Status: AC
  Filled 2023-07-16: qty 5

## 2023-07-16 MED ORDER — LIDOCAINE HCL (CARDIAC) PF 100 MG/5ML IV SOSY
PREFILLED_SYRINGE | INTRAVENOUS | Status: DC | PRN
  Administered 2023-07-16: 50 mg via INTRAVENOUS

## 2023-07-16 NOTE — H&P (Signed)
Outpatient short stay form Pre-procedure 07/16/2023 8:56 AM Austin Mccarthy K. Austin Mccarthy, M.D.  Primary Physician: Austin Mccarthy, M.D.  Reason for visit:  Personal history of adenomatous colon polyps  History of present illness:                            Patient presents for colonoscopy for a personal hx of colon polyps. The patient denies abdominal pain, abnormal weight loss or rectal bleeding.      Current Facility-Administered Medications:    0.9 %  sodium chloride infusion, , Intravenous, Continuous, [place], Boykin Nearing, MD, Last Rate: 20 mL/hr at 07/16/23 0817, New Bag at 07/16/23 0817  Medications Prior to Admission  Medication Sig Dispense Refill Last Dose/Taking   pravastatin (PRAVACHOL) 80 MG tablet Take 80 mg by mouth at bedtime.   07/15/2023   ASPIRIN LOW DOSE 81 MG EC tablet TAKE 1 TABLET BY MOUTH DAILY SWALLOW WHOLE 150 tablet 2    buPROPion (WELLBUTRIN SR) 150 MG 12 hr tablet Take 150 mg by mouth 2 (two) times daily.      clonazePAM (KLONOPIN) 1 MG tablet Take 2 mg by mouth at bedtime.  0    clopidogrel (PLAVIX) 75 MG tablet TAKE 1 TABLET(75 MG) BY MOUTH DAILY 90 tablet 3 07/10/2023   DULoxetine (CYMBALTA) 60 MG capsule Take 60 mg by mouth 2 (two) times daily.      tadalafil (CIALIS) 20 MG tablet Take 20 mg by mouth daily as needed.        Allergies  Allergen Reactions   Contrast Media [Iodinated Contrast Media] Itching and Rash   Gluten Meal Nausea Only    Other reaction(s): Abdominal Pain/ Celiac Disease     Past Medical History:  Diagnosis Date   Anemia    Celiac disease/sprue 08/21/2014   Chronic low back pain 12/28/2013   Folate deficiency    History of alcoholism (HCC) 05/29/2017   Hypercholesterolemia 04/08/2014   Hypertension 12/28/2013   Lumbar spondylosis 02/02/2014    Review of systems:  Otherwise negative.    Physical Exam  Gen: Alert, oriented. Appears stated age.  HEENT: [place]/AT. PERRLA. Lungs: CTA, no wheezes. CV: RR nl S1, S2. Abd: soft, benign,  no masses. BS+ Ext: No edema. Pulses 2+    Planned procedures: Proceed with colonoscopy. The patient understands the nature of the planned procedure, indications, risks, alternatives and potential complications including but not limited to bleeding, infection, perforation, damage to internal organs and possible oversedation/side effects from anesthesia. The patient agrees and gives consent to proceed.  Please refer to procedure notes for findings, recommendations and patient disposition/instructions.     Austin Mccarthy K. Austin Mccarthy, M.D. Gastroenterology 07/16/2023  8:56 AM

## 2023-07-16 NOTE — Interval H&P Note (Signed)
History and Physical Interval Note:  07/16/2023 8:57 AM  Austin Mccarthy  has presented today for surgery, with the diagnosis of Z86.0101 (ICD-10-CM) - Hx of adenomatous polyp of colon.  The various methods of treatment have been discussed with the patient and family. After consideration of risks, benefits and other options for treatment, the patient has consented to  Procedure(s): COLONOSCOPY WITH PROPOFOL (N/A) as a surgical intervention.  The patient's history has been reviewed, patient examined, no change in status, stable for surgery.  I have reviewed the patient's chart and labs.  Questions were answered to the patient's satisfaction.     [place], Serena

## 2023-07-16 NOTE — Anesthesia Postprocedure Evaluation (Signed)
Anesthesia Post Note  Patient: JAQUE DACY  Procedure(s) Performed: COLONOSCOPY WITH PROPOFOL  Patient location during evaluation: Endoscopy Anesthesia Type: General Level of consciousness: awake and alert Pain management: pain level controlled Vital Signs Assessment: post-procedure vital signs reviewed and stable Respiratory status: spontaneous breathing, nonlabored ventilation, respiratory function stable and patient connected to nasal cannula oxygen Cardiovascular status: blood pressure returned to baseline and stable Postop Assessment: no apparent nausea or vomiting Anesthetic complications: no   No notable events documented.   Last Vitals:  Vitals:   07/16/23 0932 07/16/23 0942  BP: 99/66 132/79  Pulse: 63 (!) 59  Resp: 19 20  Temp:    SpO2: 100% 100%    Last Pain:  Vitals:   07/16/23 0942  TempSrc:   PainSc: 0-No pain                 Corinda Gubler

## 2023-07-16 NOTE — Anesthesia Procedure Notes (Signed)
Procedure Name: General with mask airway Date/Time: 07/16/2023 9:04 AM  Performed by: Lily Lovings, CRNAPre-anesthesia Checklist: Patient identified, Emergency Drugs available, Suction available, Patient being monitored and Timeout performed Patient Re-evaluated:Patient Re-evaluated prior to induction Oxygen Delivery Method: Simple face mask Preoxygenation: Pre-oxygenation with 100% oxygen Induction Type: IV induction Comments: POM

## 2023-07-16 NOTE — Transfer of Care (Signed)
Immediate Anesthesia Transfer of Care Note  Patient: Austin Mccarthy  Procedure(s) Performed: COLONOSCOPY WITH PROPOFOL  Patient Location: Endoscopy Unit  Anesthesia Type:General  Level of Consciousness: drowsy and patient cooperative  Airway & Oxygen Therapy: Patient Spontanous Breathing and Patient connected to face mask oxygen  Post-op Assessment: Report given to RN and Post -op Vital signs reviewed and unstable, Anesthesiologist notified  Post vital signs: Reviewed and stable  Last Vitals:  Vitals Value Taken Time  BP 98/65 07/16/23 0924  Temp 35.7 C 07/16/23 0922  Pulse 68 07/16/23 0924  Resp 28 07/16/23 0924  SpO2 98 % 07/16/23 0924  Vitals shown include unfiled device data.  Last Pain:  Vitals:   07/16/23 0922  TempSrc: Tympanic  PainSc: Asleep         Complications: No notable events documented.

## 2023-07-16 NOTE — Op Note (Signed)
[facility] Patient Name: Austin Mccarthy Procedure Date: 07/16/2023 8:58 AM MRN: [id number] Account #: [id number] Date of Birth: [birth year] Admit Type: Outpatient Age: 63 Room: [facility] ENDO ROOM 2 Gender: Male Note Status: Finalized Instrument Name: Prentice Docker [id number] Procedure:             Colonoscopy Indications:           Surveillance: Personal history of adenomatous polyps                         on last colonoscopy > 5 years ago Providers:             Royce Macadamia K. Rayola Everhart MD, MD Medicines:             Propofol per Anesthesia Complications:         No immediate complications. Procedure:             Pre-Anesthesia Assessment:                        - The risks and benefits of the procedure and the                         sedation options and risks were discussed with the                         patient. All questions were answered and informed                         consent was obtained.                        - Patient identification and proposed procedure were                         verified prior to the procedure by the nurse. The                         procedure was verified in the procedure room.                        - ASA Grade Assessment: III - A patient with severe                         systemic disease.                        - After reviewing the risks and benefits, the patient                         was deemed in satisfactory condition to undergo the                         procedure.                        After obtaining informed consent, the colonoscope was                         passed under direct vision. Throughout the procedure,  the patient's blood pressure, pulse, and oxygen                         saturations were monitored continuously. The                         Colonoscope was introduced through the anus and                         advanced to the the cecum, identified by appendiceal                          orifice and ileocecal valve. The colonoscopy was                         performed without difficulty. The quality of the bowel                         preparation was good. The ileocecal valve, appendiceal                         orifice, and rectum were photographed. Findings:      The perianal and digital rectal examinations were normal. Pertinent       negatives include normal sphincter tone and no palpable rectal lesions.      Non-bleeding internal hemorrhoids were found during retroflexion. The       hemorrhoids were Grade I (internal hemorrhoids that do not prolapse).      The exam was otherwise without abnormality. Impression:            - Non-bleeding internal hemorrhoids.                        - The examination was otherwise normal.                        - No specimens collected. Recommendation:        - Patient has a contact number available for                         emergencies. The signs and symptoms of potential                         delayed complications were discussed with the patient.                         Return to normal activities tomorrow. Written                         discharge instructions were provided to the patient.                        - Resume previous diet.                        - Continue present medications.                        - Repeat colonoscopy in 10 years for screening  purposes.                        - Resume Plavix (clopidogrel) at prior dose today.                         Refer to managing physician for further adjustment of                         therapy.                        - Return to GI office PRN.                        - The findings and recommendations were discussed with                         the patient. Procedure Code(s):     --- Professional ---                        G9562, Colorectal cancer screening; colonoscopy on                         individual at high  risk Diagnosis Code(s):     --- Professional ---                        K64.0, First degree hemorrhoids                        Z86.010, Personal history of colonic polyps CPT copyright 2022 American Medical Association. All rights reserved. The codes documented in this report are preliminary and upon coder review may  be revised to meet current compliance requirements. Stanton Kidney MD, MD 07/16/2023 9:23:10 AM This report has been signed electronically. Number of Addenda: 0 Note Initiated On: 07/16/2023 8:58 AM Scope Withdrawal Time: 0 hours 6 minutes 29 seconds  Total Procedure Duration: 0 hours 15 minutes 44 seconds  Estimated Blood Loss:  Estimated blood loss: none.      [facility]

## 2023-07-16 NOTE — Anesthesia Preprocedure Evaluation (Signed)
Anesthesia Evaluation  Patient identified by MRN, date of birth, ID band Patient awake    Reviewed: Allergy & Precautions, NPO status , Patient's Chart, lab work & pertinent test results  History of Anesthesia Complications Negative for: history of anesthetic complications  Airway Mallampati: II  TM Distance: >3 FB Neck ROM: Full    Dental no notable dental hx. (+) Teeth Intact   Pulmonary neg pulmonary ROS, neg sleep apnea, neg COPD, Patient abstained from smoking.Not current smoker, former smoker   Pulmonary exam normal breath sounds clear to auscultation       Cardiovascular Exercise Tolerance: Good METShypertension, Pt. on medications + Peripheral Vascular Disease  (-) CAD and (-) Past MI (-) dysrhythmias  Rhythm:Regular Rate:Normal - Systolic murmurs    Neuro/Psych  PSYCHIATRIC DISORDERS  Depression    negative neurological ROS     GI/Hepatic ,neg GERD  ,,(+)     (-) substance abuse    Endo/Other  neg diabetes    Renal/GU negative Renal ROS     Musculoskeletal   Abdominal   Peds  Hematology   Anesthesia Other Findings Past Medical History: No date: Anemia 08/21/2014: Celiac disease/sprue 12/28/2013: Chronic low back pain No date: Folate deficiency 05/29/2017: History of alcoholism (HCC) 04/08/2014: Hypercholesterolemia 12/28/2013: Hypertension 02/02/2014: Lumbar spondylosis  Reproductive/Obstetrics                              Anesthesia Physical Anesthesia Plan  ASA: 2  Anesthesia Plan: General   Post-op Pain Management: Minimal or no pain anticipated   Induction: Intravenous  PONV Risk Score and Plan: 2 and Propofol infusion, TIVA and Ondansetron  Airway Management Planned: Nasal Cannula  Additional Equipment: None  Intra-op Plan:   Post-operative Plan:   Informed Consent: I have reviewed the patients History and Physical, chart, labs and discussed the  procedure including the risks, benefits and alternatives for the proposed anesthesia with the patient or authorized representative who has indicated his/her understanding and acceptance.     Dental advisory given  Plan Discussed with: CRNA and Surgeon  Anesthesia Plan Comments: (Discussed risks of anesthesia with patient, including possibility of difficulty with spontaneous ventilation under anesthesia necessitating airway intervention, PONV, and rare risks such as cardiac or respiratory or neurological events, and allergic reactions. Discussed the role of CRNA in patient's perioperative care. Patient understands.)         Anesthesia Quick Evaluation

## 2023-07-17 ENCOUNTER — Encounter: Payer: Self-pay | Admitting: Internal Medicine

## 2023-07-24 ENCOUNTER — Other Ambulatory Visit (INDEPENDENT_AMBULATORY_CARE_PROVIDER_SITE_OTHER): Payer: Self-pay

## 2023-07-24 MED ORDER — CLOPIDOGREL BISULFATE 75 MG PO TABS: ORAL_TABLET | ORAL | 3 refills | Status: DC

## 2023-08-23 ENCOUNTER — Other Ambulatory Visit (INDEPENDENT_AMBULATORY_CARE_PROVIDER_SITE_OTHER): Payer: Self-pay | Admitting: Nurse Practitioner

## 2023-10-21 ENCOUNTER — Encounter (INDEPENDENT_AMBULATORY_CARE_PROVIDER_SITE_OTHER): Payer: Self-pay

## 2024-01-20 ENCOUNTER — Other Ambulatory Visit (INDEPENDENT_AMBULATORY_CARE_PROVIDER_SITE_OTHER): Payer: Self-pay | Admitting: Nurse Practitioner

## 2024-01-20 DIAGNOSIS — Z9889 Other specified postprocedural states: Secondary | ICD-10-CM

## 2024-01-23 ENCOUNTER — Ambulatory Visit (INDEPENDENT_AMBULATORY_CARE_PROVIDER_SITE_OTHER): Admitting: Vascular Surgery

## 2024-01-23 ENCOUNTER — Ambulatory Visit (INDEPENDENT_AMBULATORY_CARE_PROVIDER_SITE_OTHER)

## 2024-01-23 DIAGNOSIS — I739 Peripheral vascular disease, unspecified: Secondary | ICD-10-CM

## 2024-01-23 DIAGNOSIS — Z9889 Other specified postprocedural states: Secondary | ICD-10-CM

## 2024-02-03 LAB — VAS US ABI WITH/WO TBI
Left ABI: 1.07
Right ABI: 1.06

## 2024-02-10 ENCOUNTER — Ambulatory Visit (INDEPENDENT_AMBULATORY_CARE_PROVIDER_SITE_OTHER): Admitting: Vascular Surgery

## 2024-02-10 VITALS — BP 154/88 | HR 75 | Wt 152.1 lb

## 2024-02-10 DIAGNOSIS — I70213 Atherosclerosis of native arteries of extremities with intermittent claudication, bilateral legs: Secondary | ICD-10-CM | POA: Diagnosis not present

## 2024-02-10 DIAGNOSIS — E7800 Pure hypercholesterolemia, unspecified: Secondary | ICD-10-CM

## 2024-02-10 DIAGNOSIS — I10 Essential (primary) hypertension: Secondary | ICD-10-CM

## 2024-02-10 NOTE — Assessment & Plan Note (Signed)
 lipid control important in reducing the progression of atherosclerotic disease. Continue statin therapy

## 2024-02-10 NOTE — Assessment & Plan Note (Signed)
 blood pressure control important in reducing the progression of atherosclerotic disease. On appropriate oral medications.

## 2024-02-10 NOTE — Progress Notes (Signed)
 MRN : [id number]  Austin Mccarthy is a 63 y.o. (20-Feb-1961) male who presents with chief complaint of  Chief Complaint  Patient presents with   Follow-up  .  History of Present Illness: Patient returns today in follow up of his PAD.  He is 3 years s/p aortoiliac intervention for claudication and rest pain. He is doing well.  He still has mild claudication symptoms but nothing severe. He still has some ED issues even after intervention. No rest pain or ulceration. His non-invasive studies from recently. ABIs are normal and iliac stents are patent by duplex  Current Outpatient Medications  Medication Sig Dispense Refill   ASPIRIN  LOW DOSE 81 MG EC tablet TAKE 1 TABLET BY MOUTH DAILY SWALLOW WHOLE 150 tablet 2   atorvastatin (LIPITOR) 40 MG tablet Take 40 mg by mouth daily.     buPROPion (WELLBUTRIN SR) 150 MG 12 hr tablet Take 150 mg by mouth 2 (two) times daily.     clonazePAM (KLONOPIN) 1 MG tablet Take 2 mg by mouth at bedtime.  0   clopidogrel  (PLAVIX ) 75 MG tablet TAKE 1 TABLET(75 MG) BY MOUTH DAILY 90 tablet 3   DULoxetine (CYMBALTA) 60 MG capsule Take 60 mg by mouth 2 (two) times daily.     tadalafil (CIALIS) 20 MG tablet Take 20 mg by mouth daily as needed.     No current facility-administered medications for this visit.    Past Medical History:  Diagnosis Date   Anemia    Celiac disease/sprue 08/21/2014   Chronic low back pain 12/28/2013   Folate deficiency    History of alcoholism (HCC) 05/29/2017   Hypercholesterolemia 04/08/2014   Hypertension 12/28/2013   Lumbar spondylosis 02/02/2014    Past Surgical History:  Procedure Laterality Date   COLONOSCOPY     COLONOSCOPY WITH PROPOFOL  N/A 07/16/2023   Procedure: COLONOSCOPY WITH PROPOFOL ;  Surgeon: Toledo, Ladell POUR, MD;  Location: [facility];  Service: Gastroenterology;  Laterality: N/A;   GANGLION CYST EXCISION Left    foot   LOWER EXTREMITY ANGIOGRAPHY Bilateral 12/12/2020   Procedure: LOWER EXTREMITY  ANGIOGRAPHY;  Surgeon: Jama Cordella MATSU, MD;  Location: [facility];  Service: Cardiovascular;  Laterality: Bilateral;     Social History   Tobacco Use   Smoking status: Every Day    Current packs/day: 0.00    Types: Cigarettes    Last attempt to quit: 03/30/2021    Years since quitting: 2.8   Smokeless tobacco: Never  Vaping Use   Vaping status: Never Used  Substance Use Topics   Alcohol use: No   Drug use: No      Family History  Problem Relation Age of Onset   Heart attack Father    Colon cancer Father    Prostate cancer Neg Hx    Kidney cancer Neg Hx    Bladder Cancer Neg Hx      Allergies  Allergen Reactions   Contrast Media [Iodinated Contrast Media] Itching and Rash   Gluten Meal Nausea Only    Other reaction(s): Abdominal Pain/ Celiac Disease     REVIEW OF SYSTEMS (Negative unless checked)  Constitutional: [] Weight loss  [] Fever  [] Chills Cardiac: [] Chest pain   [] Chest pressure   [] Palpitations   [] Shortness of breath when laying flat   [] Shortness of breath at rest   [] Shortness of breath with exertion. Vascular:  [x] Pain in legs with walking   [] Pain in legs at rest   [] Pain in legs when  laying flat   [x] Claudication   [] Pain in feet when walking  [] Pain in feet at rest  [] Pain in feet when laying flat   [] History of DVT   [] Phlebitis   [] Swelling in legs   [] Varicose veins   [] Non-healing ulcers Pulmonary:   [] Uses home oxygen   [] Productive cough   [] Hemoptysis   [] Wheeze  [] COPD   [] Asthma Neurologic:  [] Dizziness  [] Blackouts   [] Seizures   [] History of stroke   [] History of TIA  [] Aphasia   [] Temporary blindness   [] Dysphagia   [] Weakness or numbness in arms   [] Weakness or numbness in legs Musculoskeletal:  [] Arthritis   [] Joint swelling   [] Joint pain   [] Low back pain Hematologic:  [] Easy bruising  [] Easy bleeding   [] Hypercoagulable state   [] Anemic   Gastrointestinal:  [] Blood in stool   [] Vomiting blood  [] Gastroesophageal  reflux/heartburn   [] Abdominal pain Genitourinary:  [] Chronic kidney disease   [] Difficult urination  [] Frequent urination  [] Burning with urination   [] Hematuria Skin:  [] Rashes   [] Ulcers   [] Wounds Psychological:  [] History of anxiety   []  History of major depression.  Physical Examination  BP (!) 154/88   Pulse 75   Wt 152 lb 2 oz (69 kg)   BMI 24.55 kg/m  Gen:  WD/WN, NAD. Appears younger than stated age. Head: [place]/AT, No temporalis wasting. Ear/Nose/Throat: Hearing grossly intact, nares w/o erythema or drainage Eyes: Conjunctiva clear. Sclera non-icteric Neck: Supple.  Trachea midline Pulmonary:  Good air movement, no use of accessory muscles.  Cardiac: RRR, no JVD Vascular:  Vessel Right Left  Radial Palpable Palpable                          PT Palpable Palpable  DP Palpable Palpable   Gastrointestinal: soft, non-tender/non-distended. No guarding/reflex.  Musculoskeletal: M/S 5/5 throughout.  No deformity or atrophy. No edema. Neurologic: Sensation grossly intact in extremities.  Symmetrical.  Speech is fluent.  Psychiatric: Judgment intact, Mood & affect appropriate for pt's clinical situation. Dermatologic: No rashes or ulcers noted.  No cellulitis or open wounds.      Labs Recent Results (from the past 2160 hours)  VAS US  ABI WITH/WO TBI     Status: None   Collection Time: 01/23/24  9:30 AM  Result Value Ref Range   Right ABI 1.06    Left ABI 1.07     Radiology VAS US  AORTA/IVC/ILIACS Result Date: 02/03/2024 ABDOMINAL AORTA STUDY Patient Name:  Austin Mccarthy  Date of Exam:   01/23/2024 Medical Rec #: [id number]            Accession #:    [id number] Date of Birth: 11/11/1960            Patient Gender: M Patient Age:   73 years Exam Location:  [facility] Procedure:      VAS US  AORTA/IVC/ILIACS Referring Phys: ORVIN DARING --------------------------------------------------------------------------------  Indications: Follow up exam for known  AAA. Vascular Interventions: 12/12/2020: Abdominal Aortogram. Bilateral Distal                         Runoff. PTA and Stent placement Right Iliac                         ArteryKissing Balloon technique. PTA and Stent  placement Left Common Iliac Artery; Kissing Balloon                         technique.PTA Rt External Iliac Artery to 6mm with a                         Lutonix drug eluding balloon and Life star stent.  Comparison Study: 01/17/2022 Performing Technologist: Leafy Gibes RVS  Examination Guidelines: A complete evaluation includes B-mode imaging, spectral Doppler, color Doppler, and power Doppler as needed of all accessible portions of each vessel. Bilateral testing is considered an integral part of a complete examination. Limited examinations for reoccurring indications may be performed as noted.  Abdominal Aorta Findings: +-------------+-------+----------+----------+----------+--------+--------+ Location     AP (cm)Trans (cm)PSV (cm/s)Waveform  ThrombusComments +-------------+-------+----------+----------+----------+--------+--------+ Proximal     1.66   1.73      63        monophasic                 +-------------+-------+----------+----------+----------+--------+--------+ Mid          1.81   1.87      61        monophasic                 +-------------+-------+----------+----------+----------+--------+--------+ Distal       1.42   1.53      52        biphasic                   +-------------+-------+----------+----------+----------+--------+--------+ RT CIA Prox  0.8    0.8       182       triphasic                  +-------------+-------+----------+----------+----------+--------+--------+ RT CIA Distal0.8    1.0       157       biphasic                   +-------------+-------+----------+----------+----------+--------+--------+ RT EIA Prox  0.8    0.8       133       biphasic                    +-------------+-------+----------+----------+----------+--------+--------+ RT EIA Distal0.8    0.7       121       biphasic                   +-------------+-------+----------+----------+----------+--------+--------+ LT CIA Prox  0.7    0.9       118       biphasic                   +-------------+-------+----------+----------+----------+--------+--------+ LT CIA Distal0.8    1.0       197       biphasic                   +-------------+-------+----------+----------+----------+--------+--------+ LT EIA Prox  0.7    0.7       226       triphasic                  +-------------+-------+----------+----------+----------+--------+--------+ LT EIA Distal0.8    0.7       179       triphasic                  +-------------+-------+----------+----------+----------+--------+--------+  Summary: Abdominal Aorta: No evidence of an abdominal aortic aneurysm was visualized. The largest aortic measurement is 1.9 cm. The largest aortic diameter has decreased compared to prior exam. Previous diameter measurement was 2.6 cm obtained on 01/17/2022.  *See table(s) above for measurements and observations.  Electronically signed by Cordella Shawl MD on 02/03/2024 at 9:41:43 AM.    Final    VAS US  ABI WITH/WO TBI Result Date: 02/03/2024  LOWER EXTREMITY DOPPLER STUDY Patient Name:  Austin Mccarthy  Date of Exam:   01/23/2024 Medical Rec #: [id number]            Accession #:    [id number] Date of Birth: 1960-07-10            Patient Gender: M Patient Age:   24 years Exam Location:  [facility] Procedure:      VAS US  ABI WITH/WO TBI Referring Phys: Cordella Shawl --------------------------------------------------------------------------------  Indications: Peripheral artery disease.  Vascular Interventions: 12/12/2020: Abdominal Aortogram. Bilateral Distal                         Runoff. PTA and Stent placement Right Iliac                         ArteryKissing Balloon technique. PTA  and Stent                         placement Left Common Iliac Artery; Kissing Balloon                         technique.PTA Rt External Iliac Artery to 6mm with a                         Lutonix drug eluding balloon and Life star stent. Comparison Study: 01/17/2023 Performing Technologist: Leafy Gibes RVS  Examination Guidelines: A complete evaluation includes at minimum, Doppler waveform signals and systolic blood pressure reading at the level of bilateral brachial, anterior tibial, and posterior tibial arteries, when vessel segments are accessible. Bilateral testing is considered an integral part of a complete examination. Photoelectric Plethysmograph (PPG) waveforms and toe systolic pressure readings are included as required and additional duplex testing as needed. Limited examinations for reoccurring indications may be performed as noted.  ABI Findings: +---------+------------------+-----+---------+--------+ Right    Rt Pressure (mmHg)IndexWaveform Comment  +---------+------------------+-----+---------+--------+ Brachial 146                                      +---------+------------------+-----+---------+--------+ ATA      157               1.03 triphasic         +---------+------------------+-----+---------+--------+ PTA      162               1.06 triphasic         +---------+------------------+-----+---------+--------+ Great Toe142               0.93 Normal            +---------+------------------+-----+---------+--------+ +---------+------------------+-----+---------+-------+ Left     Lt Pressure (mmHg)IndexWaveform Comment +---------+------------------+-----+---------+-------+ Brachial 153                                     +---------+------------------+-----+---------+-------+  ATA      162               1.06 triphasic        +---------+------------------+-----+---------+-------+ PTA      163               1.07 triphasic         +---------+------------------+-----+---------+-------+ Great Toe128               0.84 Normal           +---------+------------------+-----+---------+-------+ +-------+-----------+-----------+------------+------------+ ABI/TBIToday's ABIToday's TBIPrevious ABIPrevious TBI +-------+-----------+-----------+------------+------------+ Right  1.06       .93        1.04        1.04         +-------+-----------+-----------+------------+------------+ Left   1.07       .84        1.12        .83          +-------+-----------+-----------+------------+------------+ Bilateral ABIs appear essentially unchanged compared to prior study on 01/17/2023. Bilateral TBIs appear essentially unchanged compared to prior study on 01/17/2023.  Summary: Right: Resting right ankle-brachial index is within normal range. The right toe-brachial index is normal. Left: Resting left ankle-brachial index is within normal range. The left toe-brachial index is normal. *See table(s) above for measurements and observations.  Electronically signed by Cordella Shawl MD on 02/03/2024 at 9:40:45 AM.    Final     Assessment/Plan  Hypertension blood pressure control important in reducing the progression of atherosclerotic disease. On appropriate oral medications.   Hypercholesterolemia lipid control important in reducing the progression of atherosclerotic disease. Continue statin therapy   Atherosclerosis of native arteries of extremity with intermittent claudication (HCC) ABIs are normal and iliac stents are patent by duplex. Doing well. Recheck in one year with non-invasive studies.     Selinda Gu, MD  02/10/2024 11:53 AM    This note was created with Dragon medical transcription system.  Any errors from dictation are purely unintentional

## 2024-02-10 NOTE — Assessment & Plan Note (Signed)
 ABIs are normal and iliac stents are patent by duplex. Doing well. Recheck in one year with non-invasive studies.

## 2024-05-26 ENCOUNTER — Other Ambulatory Visit (INDEPENDENT_AMBULATORY_CARE_PROVIDER_SITE_OTHER): Payer: Self-pay | Admitting: Nurse Practitioner

## 2024-07-08 ENCOUNTER — Ambulatory Visit

## 2024-07-14 ENCOUNTER — Ambulatory Visit

## 2025-02-08 ENCOUNTER — Encounter (INDEPENDENT_AMBULATORY_CARE_PROVIDER_SITE_OTHER)

## 2025-02-08 ENCOUNTER — Ambulatory Visit (INDEPENDENT_AMBULATORY_CARE_PROVIDER_SITE_OTHER): Admitting: Vascular Surgery
